# Patient Record
Sex: Male | Born: 1970 | Race: White | Hispanic: No | Marital: Married | Smoking: Never smoker
Health system: Southern US, Community
[De-identification: ages and names within clinical notes are randomized; demographics above are authoritative.]

## PROBLEM LIST (undated history)

## (undated) DIAGNOSIS — N200 Calculus of kidney: Secondary | ICD-10-CM

## (undated) DIAGNOSIS — M549 Dorsalgia, unspecified: Secondary | ICD-10-CM

## (undated) DIAGNOSIS — G8929 Other chronic pain: Secondary | ICD-10-CM

## (undated) DIAGNOSIS — I10 Essential (primary) hypertension: Secondary | ICD-10-CM

---

## 2016-03-25 DIAGNOSIS — R079 Chest pain, unspecified: Secondary | ICD-10-CM | POA: Diagnosis not present

## 2016-03-25 DIAGNOSIS — I10 Essential (primary) hypertension: Secondary | ICD-10-CM | POA: Diagnosis not present

## 2016-03-26 DIAGNOSIS — R079 Chest pain, unspecified: Secondary | ICD-10-CM | POA: Diagnosis not present

## 2016-03-26 DIAGNOSIS — I10 Essential (primary) hypertension: Secondary | ICD-10-CM | POA: Diagnosis not present

## 2016-03-27 DIAGNOSIS — R079 Chest pain, unspecified: Secondary | ICD-10-CM | POA: Diagnosis not present

## 2016-03-27 DIAGNOSIS — I10 Essential (primary) hypertension: Secondary | ICD-10-CM | POA: Diagnosis not present

## 2016-03-28 DIAGNOSIS — R079 Chest pain, unspecified: Secondary | ICD-10-CM | POA: Diagnosis not present

## 2016-03-28 DIAGNOSIS — I10 Essential (primary) hypertension: Secondary | ICD-10-CM | POA: Diagnosis not present

## 2016-06-30 DIAGNOSIS — R109 Unspecified abdominal pain: Secondary | ICD-10-CM | POA: Diagnosis not present

## 2016-06-30 DIAGNOSIS — N200 Calculus of kidney: Secondary | ICD-10-CM | POA: Diagnosis not present

## 2016-06-30 DIAGNOSIS — N132 Hydronephrosis with renal and ureteral calculous obstruction: Secondary | ICD-10-CM | POA: Diagnosis not present

## 2016-07-02 DIAGNOSIS — N200 Calculus of kidney: Secondary | ICD-10-CM | POA: Diagnosis not present

## 2016-07-02 DIAGNOSIS — I10 Essential (primary) hypertension: Secondary | ICD-10-CM | POA: Diagnosis not present

## 2016-07-02 DIAGNOSIS — N23 Unspecified renal colic: Secondary | ICD-10-CM | POA: Diagnosis not present

## 2016-07-02 DIAGNOSIS — N202 Calculus of kidney with calculus of ureter: Secondary | ICD-10-CM | POA: Diagnosis not present

## 2016-07-02 DIAGNOSIS — Z79899 Other long term (current) drug therapy: Secondary | ICD-10-CM | POA: Diagnosis not present

## 2016-07-02 DIAGNOSIS — N201 Calculus of ureter: Secondary | ICD-10-CM | POA: Diagnosis not present

## 2016-07-03 DIAGNOSIS — N201 Calculus of ureter: Secondary | ICD-10-CM | POA: Diagnosis not present

## 2016-07-03 DIAGNOSIS — N23 Unspecified renal colic: Secondary | ICD-10-CM | POA: Diagnosis not present

## 2016-07-03 DIAGNOSIS — I10 Essential (primary) hypertension: Secondary | ICD-10-CM | POA: Diagnosis not present

## 2016-07-03 DIAGNOSIS — Z79899 Other long term (current) drug therapy: Secondary | ICD-10-CM | POA: Diagnosis not present

## 2016-07-04 DIAGNOSIS — N201 Calculus of ureter: Secondary | ICD-10-CM | POA: Diagnosis not present

## 2016-07-04 DIAGNOSIS — N3020 Other chronic cystitis without hematuria: Secondary | ICD-10-CM | POA: Diagnosis not present

## 2016-07-06 DIAGNOSIS — N201 Calculus of ureter: Secondary | ICD-10-CM | POA: Diagnosis not present

## 2017-03-03 DIAGNOSIS — R634 Abnormal weight loss: Secondary | ICD-10-CM | POA: Diagnosis not present

## 2017-03-03 DIAGNOSIS — I10 Essential (primary) hypertension: Secondary | ICD-10-CM | POA: Diagnosis not present

## 2017-03-03 DIAGNOSIS — M549 Dorsalgia, unspecified: Secondary | ICD-10-CM | POA: Diagnosis not present

## 2017-03-03 DIAGNOSIS — Z6826 Body mass index (BMI) 26.0-26.9, adult: Secondary | ICD-10-CM | POA: Diagnosis not present

## 2017-03-24 DIAGNOSIS — M545 Low back pain: Secondary | ICD-10-CM | POA: Diagnosis not present

## 2017-03-24 DIAGNOSIS — M549 Dorsalgia, unspecified: Secondary | ICD-10-CM | POA: Diagnosis not present

## 2017-03-24 DIAGNOSIS — I10 Essential (primary) hypertension: Secondary | ICD-10-CM | POA: Diagnosis not present

## 2017-06-20 DIAGNOSIS — I10 Essential (primary) hypertension: Secondary | ICD-10-CM | POA: Diagnosis not present

## 2017-06-20 DIAGNOSIS — M791 Myalgia: Secondary | ICD-10-CM | POA: Diagnosis not present

## 2017-06-20 DIAGNOSIS — R42 Dizziness and giddiness: Secondary | ICD-10-CM | POA: Diagnosis not present

## 2017-06-20 DIAGNOSIS — R5383 Other fatigue: Secondary | ICD-10-CM | POA: Diagnosis not present

## 2017-07-09 DIAGNOSIS — N200 Calculus of kidney: Secondary | ICD-10-CM | POA: Diagnosis not present

## 2017-07-09 DIAGNOSIS — N201 Calculus of ureter: Secondary | ICD-10-CM | POA: Diagnosis not present

## 2017-07-10 DIAGNOSIS — N201 Calculus of ureter: Secondary | ICD-10-CM | POA: Diagnosis not present

## 2017-07-10 DIAGNOSIS — N1330 Unspecified hydronephrosis: Secondary | ICD-10-CM | POA: Diagnosis not present

## 2017-07-10 DIAGNOSIS — Z79899 Other long term (current) drug therapy: Secondary | ICD-10-CM | POA: Diagnosis not present

## 2017-07-10 DIAGNOSIS — A419 Sepsis, unspecified organism: Secondary | ICD-10-CM | POA: Diagnosis not present

## 2017-07-10 DIAGNOSIS — Z87442 Personal history of urinary calculi: Secondary | ICD-10-CM | POA: Diagnosis not present

## 2017-07-10 DIAGNOSIS — N179 Acute kidney failure, unspecified: Secondary | ICD-10-CM | POA: Diagnosis not present

## 2017-07-10 DIAGNOSIS — I10 Essential (primary) hypertension: Secondary | ICD-10-CM | POA: Diagnosis not present

## 2017-07-10 DIAGNOSIS — N139 Obstructive and reflux uropathy, unspecified: Secondary | ICD-10-CM | POA: Diagnosis not present

## 2017-07-10 DIAGNOSIS — N200 Calculus of kidney: Secondary | ICD-10-CM | POA: Diagnosis not present

## 2017-07-10 DIAGNOSIS — R109 Unspecified abdominal pain: Secondary | ICD-10-CM | POA: Diagnosis not present

## 2017-07-10 DIAGNOSIS — N132 Hydronephrosis with renal and ureteral calculous obstruction: Secondary | ICD-10-CM | POA: Diagnosis not present

## 2017-07-10 DIAGNOSIS — N390 Urinary tract infection, site not specified: Secondary | ICD-10-CM | POA: Diagnosis not present

## 2017-07-11 DIAGNOSIS — N201 Calculus of ureter: Secondary | ICD-10-CM | POA: Diagnosis not present

## 2017-07-11 DIAGNOSIS — N139 Obstructive and reflux uropathy, unspecified: Secondary | ICD-10-CM | POA: Diagnosis not present

## 2017-07-11 DIAGNOSIS — N132 Hydronephrosis with renal and ureteral calculous obstruction: Secondary | ICD-10-CM | POA: Diagnosis not present

## 2017-07-11 DIAGNOSIS — N1330 Unspecified hydronephrosis: Secondary | ICD-10-CM | POA: Diagnosis not present

## 2017-07-11 DIAGNOSIS — N390 Urinary tract infection, site not specified: Secondary | ICD-10-CM | POA: Diagnosis not present

## 2017-07-12 DIAGNOSIS — N201 Calculus of ureter: Secondary | ICD-10-CM | POA: Diagnosis not present

## 2017-07-12 DIAGNOSIS — N390 Urinary tract infection, site not specified: Secondary | ICD-10-CM | POA: Diagnosis not present

## 2017-07-12 DIAGNOSIS — N132 Hydronephrosis with renal and ureteral calculous obstruction: Secondary | ICD-10-CM | POA: Diagnosis not present

## 2017-07-12 DIAGNOSIS — N139 Obstructive and reflux uropathy, unspecified: Secondary | ICD-10-CM | POA: Diagnosis not present

## 2017-07-28 DIAGNOSIS — N201 Calculus of ureter: Secondary | ICD-10-CM | POA: Diagnosis not present

## 2017-07-28 DIAGNOSIS — N200 Calculus of kidney: Secondary | ICD-10-CM | POA: Diagnosis not present

## 2017-07-28 DIAGNOSIS — I10 Essential (primary) hypertension: Secondary | ICD-10-CM | POA: Diagnosis not present

## 2017-07-28 DIAGNOSIS — Z9289 Personal history of other medical treatment: Secondary | ICD-10-CM | POA: Diagnosis not present

## 2017-07-28 DIAGNOSIS — N3020 Other chronic cystitis without hematuria: Secondary | ICD-10-CM | POA: Diagnosis not present

## 2017-07-28 DIAGNOSIS — M549 Dorsalgia, unspecified: Secondary | ICD-10-CM | POA: Diagnosis not present

## 2017-08-24 DIAGNOSIS — I10 Essential (primary) hypertension: Secondary | ICD-10-CM | POA: Diagnosis not present

## 2017-08-24 DIAGNOSIS — M542 Cervicalgia: Secondary | ICD-10-CM | POA: Diagnosis not present

## 2017-08-24 DIAGNOSIS — M545 Low back pain: Secondary | ICD-10-CM | POA: Diagnosis not present

## 2017-08-24 DIAGNOSIS — M549 Dorsalgia, unspecified: Secondary | ICD-10-CM | POA: Diagnosis not present

## 2017-11-09 DIAGNOSIS — I10 Essential (primary) hypertension: Secondary | ICD-10-CM | POA: Diagnosis not present

## 2018-03-30 DIAGNOSIS — Z1331 Encounter for screening for depression: Secondary | ICD-10-CM | POA: Diagnosis not present

## 2018-03-30 DIAGNOSIS — I10 Essential (primary) hypertension: Secondary | ICD-10-CM | POA: Diagnosis not present

## 2018-03-30 DIAGNOSIS — R5382 Chronic fatigue, unspecified: Secondary | ICD-10-CM | POA: Diagnosis not present

## 2018-03-30 DIAGNOSIS — N200 Calculus of kidney: Secondary | ICD-10-CM | POA: Diagnosis not present

## 2018-03-30 DIAGNOSIS — F5104 Psychophysiologic insomnia: Secondary | ICD-10-CM | POA: Diagnosis not present

## 2018-03-30 DIAGNOSIS — M545 Low back pain: Secondary | ICD-10-CM | POA: Diagnosis not present

## 2018-04-12 DIAGNOSIS — F5104 Psychophysiologic insomnia: Secondary | ICD-10-CM | POA: Diagnosis not present

## 2018-04-12 DIAGNOSIS — M545 Low back pain: Secondary | ICD-10-CM | POA: Diagnosis not present

## 2018-04-12 DIAGNOSIS — N200 Calculus of kidney: Secondary | ICD-10-CM | POA: Diagnosis not present

## 2018-04-12 DIAGNOSIS — I10 Essential (primary) hypertension: Secondary | ICD-10-CM | POA: Diagnosis not present

## 2018-04-20 DIAGNOSIS — N200 Calculus of kidney: Secondary | ICD-10-CM | POA: Diagnosis not present

## 2018-04-20 DIAGNOSIS — I10 Essential (primary) hypertension: Secondary | ICD-10-CM | POA: Diagnosis not present

## 2018-04-20 DIAGNOSIS — E291 Testicular hypofunction: Secondary | ICD-10-CM | POA: Diagnosis not present

## 2018-04-20 DIAGNOSIS — M545 Low back pain: Secondary | ICD-10-CM | POA: Diagnosis not present

## 2018-04-20 DIAGNOSIS — F5104 Psychophysiologic insomnia: Secondary | ICD-10-CM | POA: Diagnosis not present

## 2018-05-06 DIAGNOSIS — N200 Calculus of kidney: Secondary | ICD-10-CM | POA: Diagnosis not present

## 2018-05-06 DIAGNOSIS — R55 Syncope and collapse: Secondary | ICD-10-CM | POA: Diagnosis not present

## 2018-05-06 DIAGNOSIS — R531 Weakness: Secondary | ICD-10-CM | POA: Diagnosis not present

## 2018-05-06 DIAGNOSIS — E860 Dehydration: Secondary | ICD-10-CM | POA: Diagnosis not present

## 2018-05-06 DIAGNOSIS — I10 Essential (primary) hypertension: Secondary | ICD-10-CM | POA: Diagnosis not present

## 2018-05-06 DIAGNOSIS — R42 Dizziness and giddiness: Secondary | ICD-10-CM | POA: Diagnosis not present

## 2018-05-06 DIAGNOSIS — N179 Acute kidney failure, unspecified: Secondary | ICD-10-CM | POA: Diagnosis not present

## 2018-05-06 DIAGNOSIS — N134 Hydroureter: Secondary | ICD-10-CM | POA: Diagnosis not present

## 2018-05-06 DIAGNOSIS — G8929 Other chronic pain: Secondary | ICD-10-CM | POA: Diagnosis not present

## 2018-05-06 DIAGNOSIS — I951 Orthostatic hypotension: Secondary | ICD-10-CM | POA: Diagnosis not present

## 2018-05-07 DIAGNOSIS — I10 Essential (primary) hypertension: Secondary | ICD-10-CM | POA: Diagnosis not present

## 2018-05-07 DIAGNOSIS — N201 Calculus of ureter: Secondary | ICD-10-CM | POA: Diagnosis not present

## 2018-05-07 DIAGNOSIS — R55 Syncope and collapse: Secondary | ICD-10-CM | POA: Diagnosis not present

## 2018-05-07 DIAGNOSIS — N179 Acute kidney failure, unspecified: Secondary | ICD-10-CM | POA: Diagnosis not present

## 2018-05-09 ENCOUNTER — Encounter (HOSPITAL_BASED_OUTPATIENT_CLINIC_OR_DEPARTMENT_OTHER): Payer: Self-pay | Admitting: *Deleted

## 2018-05-09 ENCOUNTER — Emergency Department (HOSPITAL_BASED_OUTPATIENT_CLINIC_OR_DEPARTMENT_OTHER)
Admission: EM | Admit: 2018-05-09 | Discharge: 2018-05-09 | Disposition: A | Discharge status: Home / Self Care | Payer: BLUE CROSS/BLUE SHIELD | Attending: Emergency Medicine | Admitting: Emergency Medicine

## 2018-05-09 ENCOUNTER — Emergency Department (HOSPITAL_BASED_OUTPATIENT_CLINIC_OR_DEPARTMENT_OTHER): Payer: BLUE CROSS/BLUE SHIELD

## 2018-05-09 ENCOUNTER — Other Ambulatory Visit: Payer: Self-pay

## 2018-05-09 DIAGNOSIS — R55 Syncope and collapse: Secondary | ICD-10-CM

## 2018-05-09 DIAGNOSIS — Z79899 Other long term (current) drug therapy: Secondary | ICD-10-CM | POA: Insufficient documentation

## 2018-05-09 DIAGNOSIS — I10 Essential (primary) hypertension: Secondary | ICD-10-CM | POA: Diagnosis not present

## 2018-05-09 DIAGNOSIS — R0602 Shortness of breath: Secondary | ICD-10-CM | POA: Diagnosis not present

## 2018-05-09 DIAGNOSIS — R42 Dizziness and giddiness: Secondary | ICD-10-CM | POA: Diagnosis not present

## 2018-05-09 DIAGNOSIS — R002 Palpitations: Secondary | ICD-10-CM | POA: Diagnosis not present

## 2018-05-09 DIAGNOSIS — R0789 Other chest pain: Secondary | ICD-10-CM | POA: Diagnosis not present

## 2018-05-09 HISTORY — DX: Essential (primary) hypertension: I10

## 2018-05-09 HISTORY — DX: Calculus of kidney: N20.0

## 2018-05-09 HISTORY — DX: Dorsalgia, unspecified: M54.9

## 2018-05-09 HISTORY — DX: Other chronic pain: G89.29

## 2018-05-09 LAB — CBC WITH DIFFERENTIAL/PLATELET
BASOS ABS: 0 10*3/uL (ref 0.0–0.1)
BASOS PCT: 0 %
Eosinophils Absolute: 0.1 10*3/uL (ref 0.0–0.7)
Eosinophils Relative: 1 %
HCT: 39.1 % (ref 39.0–52.0)
HEMOGLOBIN: 14.1 g/dL (ref 13.0–17.0)
LYMPHS PCT: 19 %
Lymphs Abs: 2.5 10*3/uL (ref 0.7–4.0)
MCH: 30.4 pg (ref 26.0–34.0)
MCHC: 36.1 g/dL — ABNORMAL HIGH (ref 30.0–36.0)
MCV: 84.3 fL (ref 78.0–100.0)
MONO ABS: 0.8 10*3/uL (ref 0.1–1.0)
Monocytes Relative: 6 %
Neutro Abs: 9.6 10*3/uL — ABNORMAL HIGH (ref 1.7–7.7)
Neutrophils Relative %: 74 %
Platelets: 275 10*3/uL (ref 150–400)
RBC: 4.64 MIL/uL (ref 4.22–5.81)
RDW: 12.8 % (ref 11.5–15.5)
WBC: 12.9 10*3/uL — ABNORMAL HIGH (ref 4.0–10.5)

## 2018-05-09 LAB — BASIC METABOLIC PANEL
ANION GAP: 9 (ref 5–15)
BUN: 13 mg/dL (ref 6–20)
CO2: 30 mmol/L (ref 22–32)
Calcium: 9.2 mg/dL (ref 8.9–10.3)
Chloride: 102 mmol/L (ref 98–111)
Creatinine, Ser: 1.29 mg/dL — ABNORMAL HIGH (ref 0.61–1.24)
GFR calc non Af Amer: >60 mL/min (ref >60)
GLUCOSE: 94 mg/dL (ref 70–99)
POTASSIUM: 4.3 mmol/L (ref 3.5–5.1)
Sodium: 141 mmol/L (ref 135–145)

## 2018-05-09 LAB — RAPID URINE DRUG SCREEN, HOSP PERFORMED
AMPHETAMINES: NOT DETECTED
Benzodiazepines: NOT DETECTED
Cocaine: NOT DETECTED
Opiates: NOT DETECTED
TETRAHYDROCANNABINOL: NOT DETECTED

## 2018-05-09 LAB — URINALYSIS, ROUTINE W REFLEX MICROSCOPIC
BILIRUBIN URINE: NEGATIVE
GLUCOSE, UA: NEGATIVE mg/dL
Hgb urine dipstick: NEGATIVE
Ketones, ur: NEGATIVE mg/dL
LEUKOCYTES UA: NEGATIVE
NITRITE: NEGATIVE
PH: 7 (ref 5.0–8.0)
PROTEIN: NEGATIVE mg/dL

## 2018-05-09 LAB — TROPONIN I: Troponin I: <0.03 ng/mL (ref <0.03)

## 2018-05-09 LAB — D-DIMER, QUANTITATIVE: D-Dimer, Quant: 0.39 ug/mL-FEU (ref 0.00–0.50)

## 2018-05-09 MED ORDER — SODIUM CHLORIDE 0.9 % IV BOLUS
500.0000 mL | Freq: Once | INTRAVENOUS | Status: AC
  Administered 2018-05-09: 500 mL via INTRAVENOUS

## 2018-05-09 NOTE — ED Notes (Signed)
Pt in XR. 

## 2018-05-09 NOTE — ED Notes (Signed)
Per wife, pt has lost about 20 lbs in 2-3 weeks

## 2018-05-09 NOTE — ED Provider Notes (Signed)
[facility] EMERGENCY DEPARTMENT Provider Note   CSN: [id number] Arrival date & time: 05/09/18  1257     History   Chief Complaint Chief Complaint  Patient presents with  . Dizziness    HPI [facility] Gaynor Ferreras. is a 47 y.o. male.  HPI Patient is a 47 year old male with a history of hypertension, kidney stones and chronic back pain who presents the emergency department today with his wife to be evaluated for lightheadedness and near syncope for the last 1 to 2 weeks.  Patient's wife is at bedside and assists with the history.  She states that patient was seen in the [place] [facility] on 04/06/2018 with similar symptoms.  He was found to have orthostatic hypotension with blood pressures around the 80/50 range.  He was given several liters of fluids and admitted to the hospital. He was also found to have a ureteronephrolithiasis with hydronephrosis. His symptoms improved after fluids he was ultimately discharged with instructions to d/c his antihypertensive medication until he was seen by his pcp. States he was discharged yesterday and has PCP appt tomorrow, however he returned to work today and began to have sxs again.   Pt reports that he has had several episodes of near syncope today. He states that when he stands up he feels lightheaded, "feels shaky all over", his eyes got blurry and his hearing becomes muffled. States he also feels somewhat short of breath during the episodes. He endorses constant midsternal chest pressure for the last 2 days. States that sxs are mild. Reports chronic palpitations that are unchanged during the episodes. Denies that he had actually had a syncopal event. States that symptoms improve when he sits down and rests. Reports that currently he feels like he has a weight on his chest.  States sypmotms are mild. No current SOB. Reports nausea, no vomiting. Mild dry cough, no hemoptysis.  No BLE swelling. No fevers. No numbness/weakness to arms or legs. No slurred  speech or facial droop. No h/o DVT/PE.   Wife at bedside states that pt has lost about 15-20 pounds in about 2-3 weeks. She is unsure if this is attributed to stress as she was recently diagnosed with VTE and pt has been under stress. He denies any changes in diet. Denies new medications.   Has a h/o HTN. No HLD. Does not use tobacco and denies h/o tobacco use. States his dad had an MI at 40 years old.   Reviewed prior records. Pt had cath in 03/28/2016 that showed,  "1. Normal coronary arteriogram. 2. Normal LV systolic function, EF 55-60% 3. Normal LV regional wall motion. 4. Normal LVEDP.  LMCA: Normal appearance with 0% stenosis. LAD: Normal appearance with 0% stenosis. LCx: Normal appearance with 0% stenosis. RCA: Normal appearance with 0% stenosis."  Past Medical History:  Diagnosis Date  . Chronic back pain   . Hypertension   . Kidney stones     There are no active problems to display for this patient.   History reviewed. No pertinent surgical history.      Home Medications    Prior to Admission medications   Medication Sig Start Date End Date Taking? Authorizing Provider  amLODipine (NORVASC) 10 MG tablet Take 10 mg by mouth daily.   Yes [provider]  DULoxetine (CYMBALTA) 60 MG capsule Take 60 mg by mouth daily.   Yes [provider]  gabapentin (NEURONTIN) 300 MG capsule Take 300 mg by mouth 3 (three) times daily.   Yes  [provider]  lisinopril (PRINIVIL,ZESTRIL) 20 MG tablet Take 20 mg by mouth daily.   Yes [provider]  Meloxicam 7.5 MG TBDP Take by mouth.   Yes [provider]  traMADol (ULTRAM) 50 MG tablet Take by mouth every 6 (six) hours as needed.   Yes [provider]    Family History History reviewed. No pertinent family history.  Social History Social History   Tobacco Use  . Smoking status: Never Smoker  . Smokeless tobacco: Current User    Types: Snuff  Substance Use Topics  .  Alcohol use: Not Currently  . Drug use: Never     Allergies   Patient has no known allergies.   Review of Systems Review of Systems  Constitutional: Negative for fever.  HENT: Negative for congestion, rhinorrhea and sore throat.   Eyes: Negative for visual disturbance.  Respiratory: Positive for cough. Negative for shortness of breath.   Cardiovascular: Positive for palpitations (chronic). Negative for leg swelling.       Chest pressure  Gastrointestinal: Negative for abdominal pain, constipation, diarrhea, nausea and vomiting.  Genitourinary: Negative for dysuria, flank pain, frequency, hematuria and urgency.  Musculoskeletal: Positive for back pain (chronic).  Skin: Negative for color change and wound.  Neurological: Positive for light-headedness. Negative for dizziness, weakness, numbness and headaches.       Near syncope    Physical Exam Updated Vital Signs BP (!) 144/95   Pulse 62   Temp 98.3 F (36.8 C) (Oral)   Resp 20   Ht 6\' 2"  (1.88 m)   Wt 92.5 kg (204 lb)   SpO2 97%   BMI 26.19 kg/m   Physical Exam  Constitutional: He appears well-developed and well-nourished.  Nontoxic appearing  HENT:  Head: Normocephalic and atraumatic.  Mouth/Throat: Oropharynx is clear and moist.  Eyes: Pupils are equal, round, and reactive to light. Conjunctivae and EOM are normal.  No horizontal or vertical nystagmus. Pupils 4-5 mm bilaterally.  Neck: Neck supple.  Cardiovascular: Normal rate, regular rhythm, normal heart sounds and intact distal pulses.  No murmur heard. Pulmonary/Chest: Effort normal and breath sounds normal. No stridor. No respiratory distress. He has no wheezes.  Abdominal: Soft. Bowel sounds are normal. He exhibits no distension. There is no tenderness. There is no guarding.  Musculoskeletal: He exhibits no edema.  Neurological: He is alert.  Mental Status:  Alert, thought content appropriate, able to give a coherent history. Speech fluent without evidence  of aphasia. Able to follow 2 step commands without difficulty.  Cranial Nerves:  II:  Peripheral visual fields grossly normal, pupils equal, round, reactive to light III,IV, VI: ptosis not present, extra-ocular motions intact bilaterally  V,VII: smile symmetric, facial light touch sensation equal VIII: hearing grossly normal to voice  X: uvula elevates symmetrically  XI: bilateral shoulder shrug symmetric and strong XII: midline tongue extension without fassiculations Motor:  Normal tone. 5/5 strength of BUE and BLE major muscle groups including strong and equal grip strength and dorsiflexion/plantar flexion Sensory: light touch normal in all extremities. DTRs: biceps and achilles 2+ symmetric b/l Cerebellar: normal finger-to-nose with bilateral upper extremities, normal heel-to-shin bilaterally Gait: normal gait and balance.  CV: 2+ radial and DP/PT pulses Negative pronator drift.  Negative Romberg.  Skin: Skin is warm and dry. Capillary refill takes less than 2 seconds.  Psychiatric: He has a normal mood and affect.  Nursing note and vitals reviewed.  ED Treatments / Results  Labs (all labs ordered are listed,  but only abnormal results are displayed) Labs Reviewed  CBC WITH DIFFERENTIAL/PLATELET - Abnormal; Notable for the following components:      Result Value   WBC 12.9 (*)    MCHC 36.1 (*)    Neutro Abs 9.6 (*)    All other components within normal limits  BASIC METABOLIC PANEL - Abnormal; Notable for the following components:   Creatinine, Ser 1.29 (*)    All other components within normal limits  URINALYSIS, ROUTINE W REFLEX MICROSCOPIC - Abnormal; Notable for the following components:   Specific Gravity, Urine <1.005 (*)    All other components within normal limits  RAPID URINE DRUG SCREEN, HOSP PERFORMED - Abnormal; Notable for the following components:   Barbiturates   (*)    Value: Result not available. Reagent lot number recalled by manufacturer.   All other  components within normal limits  URINE CULTURE  TROPONIN I  D-DIMER, QUANTITATIVE (NOT AT [facility])    EKG EKG Interpretation  Date/Time:  Wednesday May 09 2018 15:02:50 EDT Ventricular Rate:  70 PR Interval:    QRS Duration: 91 QT Interval:  392 QTC Calculation: 423 R Axis:   -11 Text Interpretation:  Sinus rhythm No old tracing to compare Confirmed by Azalia Bilisampos, Kevin ([id number]) on 05/09/2018 4:15:48 PM   Radiology Dg Chest 2 View  Result Date: 05/09/2018 CLINICAL DATA:  47 year old male with chest pressure for 2 days. Shortness of breath this morning, near-syncope. EXAM: CHEST - 2 VIEW COMPARISON:  05/06/2018 and earlier. FINDINGS: Lung volumes and mediastinal contours are within normal limits. Visualized tracheal air column is within normal limits. Both lungs appear clear. No pneumothorax or pleural effusion. Negative visible bowel gas and osseous structures. IMPRESSION: Negative.  No acute cardiopulmonary abnormality. Electronically Signed   By: Odessa FlemingH  Hall M.D.   On: 05/09/2018 15:17    Procedures Procedures (including critical care time)  Medications Ordered in ED Medications  sodium chloride 0.9 % bolus 500 mL (0 mLs Intravenous Stopped 05/09/18 1645)     Initial Impression / Assessment and Plan / ED Course  I have reviewed the triage vital signs and the nursing notes.  Pertinent labs & imaging results that were available during my care of the patient were reviewed by me and considered in my medical decision making (see chart for details).    Discussed pt presentation and exam findings with Dr. Patria Maneampos, who agrees with the current workup.  Personally evaluated the patient and agrees with the plan for discharge with close outpatient follow-up with his PCP and cardiologist.  [facility] reviewed.  Final Clinical Impressions(s) / ED Diagnoses   Final diagnoses:  Near syncope   Patient presented with near syncope.  Was [facility] and  had extensive work-up during this admission.  On this admission was found to have orthostatic hypotension.  Symptoms improved after menstruation of IV fluids.  Patient presenting today and has stable vital signs other than some mild hypertension.  Note to be to this to him easily discontinuing his antihypertensive medication after being instructed to do so during his last admission.  He is not orthostatic today.  His neurologic exam is nonfocal and it is benign.  Cardiac and pulmonary exams are benign.  CBC notable for leukocytosis 12.9. Nonspecific finding that Could be attributed to his recently diagnosed ureteral stone.  BMP with mildly elevated creatinine to 1.29.  Troponin negative.  UA negative for UTI.  No hematuria noted.  UDS negative.  D-dimer negative.  ECG NSR, no ischemic changes. No arrhythmia. CXR without evidence of pneumonia or other abnormality.  Etiology of patient's symptoms today are currently unclear, however feel that the patient has been reasonably screened and does not have any other evidence of emergent/urgent pathology that would require further work-up or admission to the hospital today as his workup today has been grossly negative and he had a benign physical exam. Advised the patient that he needs to follow-up with his primary care doctor tomorrow for reevaluation and further work-up.  Advised also to contact his cardiologist with regard to his complaints of chest pressure today.  Advised him to return to the ER for any new or worsening symptoms in the meantime.  Patient and wife at bedside understand the plan and reasons to return immediately to the ED.  All questions answered.  ED Discharge Orders    None       Rayne Du 05/09/18 1811    Azalia Bilis, MD 05/10/18 1029

## 2018-05-09 NOTE — Discharge Instructions (Signed)
Please follow up with your primary care provider within 5-7 days for re-evaluation of your symptoms. If you do not have a primary care provider, information for a healthcare clinic has been provided for you to make arrangements for follow up care. Please return to the emergency department for any new or worsening symptoms. ° °

## 2018-05-09 NOTE — ED Triage Notes (Signed)
Pt c/o dizziness x 2 weeks, admit [facility] for 2 days DX dehydration , unable to f/u with PMD

## 2018-05-10 DIAGNOSIS — F5104 Psychophysiologic insomnia: Secondary | ICD-10-CM | POA: Diagnosis not present

## 2018-05-10 DIAGNOSIS — I10 Essential (primary) hypertension: Secondary | ICD-10-CM | POA: Diagnosis not present

## 2018-05-10 DIAGNOSIS — N200 Calculus of kidney: Secondary | ICD-10-CM | POA: Diagnosis not present

## 2018-05-10 LAB — URINE CULTURE: CULTURE: NO GROWTH

## 2018-05-11 DIAGNOSIS — R5383 Other fatigue: Secondary | ICD-10-CM | POA: Diagnosis not present

## 2018-05-11 DIAGNOSIS — Z79899 Other long term (current) drug therapy: Secondary | ICD-10-CM | POA: Diagnosis not present

## 2018-05-11 DIAGNOSIS — R55 Syncope and collapse: Secondary | ICD-10-CM | POA: Diagnosis not present

## 2018-05-14 DIAGNOSIS — I10 Essential (primary) hypertension: Secondary | ICD-10-CM | POA: Diagnosis not present

## 2018-05-14 DIAGNOSIS — F5104 Psychophysiologic insomnia: Secondary | ICD-10-CM | POA: Diagnosis not present

## 2018-05-14 DIAGNOSIS — Z79899 Other long term (current) drug therapy: Secondary | ICD-10-CM | POA: Diagnosis not present

## 2018-05-14 DIAGNOSIS — N200 Calculus of kidney: Secondary | ICD-10-CM | POA: Diagnosis not present

## 2018-05-16 DIAGNOSIS — E860 Dehydration: Secondary | ICD-10-CM | POA: Diagnosis not present

## 2018-05-16 DIAGNOSIS — D519 Vitamin B12 deficiency anemia, unspecified: Secondary | ICD-10-CM | POA: Diagnosis not present

## 2018-05-16 DIAGNOSIS — N1330 Unspecified hydronephrosis: Secondary | ICD-10-CM | POA: Diagnosis not present

## 2018-05-16 DIAGNOSIS — G8929 Other chronic pain: Secondary | ICD-10-CM | POA: Diagnosis not present

## 2018-05-16 DIAGNOSIS — I10 Essential (primary) hypertension: Secondary | ICD-10-CM | POA: Diagnosis not present

## 2018-05-16 DIAGNOSIS — M545 Low back pain: Secondary | ICD-10-CM | POA: Diagnosis not present

## 2018-05-16 DIAGNOSIS — R0789 Other chest pain: Secondary | ICD-10-CM | POA: Diagnosis not present

## 2018-05-16 DIAGNOSIS — R634 Abnormal weight loss: Secondary | ICD-10-CM | POA: Diagnosis not present

## 2018-05-16 DIAGNOSIS — I951 Orthostatic hypotension: Secondary | ICD-10-CM | POA: Diagnosis not present

## 2018-05-16 DIAGNOSIS — N179 Acute kidney failure, unspecified: Secondary | ICD-10-CM | POA: Diagnosis not present

## 2018-05-16 DIAGNOSIS — R6881 Early satiety: Secondary | ICD-10-CM | POA: Diagnosis not present

## 2018-05-16 DIAGNOSIS — R55 Syncope and collapse: Secondary | ICD-10-CM | POA: Diagnosis not present

## 2018-05-16 DIAGNOSIS — R42 Dizziness and giddiness: Secondary | ICD-10-CM | POA: Diagnosis not present

## 2018-05-16 DIAGNOSIS — N132 Hydronephrosis with renal and ureteral calculous obstruction: Secondary | ICD-10-CM | POA: Diagnosis not present

## 2018-05-16 DIAGNOSIS — K641 Second degree hemorrhoids: Secondary | ICD-10-CM | POA: Diagnosis not present

## 2018-05-16 DIAGNOSIS — R000 Tachycardia, unspecified: Secondary | ICD-10-CM | POA: Diagnosis not present

## 2018-05-16 DIAGNOSIS — R109 Unspecified abdominal pain: Secondary | ICD-10-CM | POA: Diagnosis not present

## 2018-05-16 DIAGNOSIS — D509 Iron deficiency anemia, unspecified: Secondary | ICD-10-CM | POA: Diagnosis not present

## 2018-05-16 DIAGNOSIS — R0902 Hypoxemia: Secondary | ICD-10-CM | POA: Diagnosis not present

## 2018-05-16 DIAGNOSIS — N261 Atrophy of kidney (terminal): Secondary | ICD-10-CM | POA: Diagnosis not present

## 2018-05-16 DIAGNOSIS — N289 Disorder of kidney and ureter, unspecified: Secondary | ICD-10-CM | POA: Diagnosis not present

## 2018-05-16 DIAGNOSIS — R079 Chest pain, unspecified: Secondary | ICD-10-CM | POA: Diagnosis not present

## 2018-05-17 DIAGNOSIS — D519 Vitamin B12 deficiency anemia, unspecified: Secondary | ICD-10-CM | POA: Diagnosis not present

## 2018-05-17 DIAGNOSIS — M545 Low back pain: Secondary | ICD-10-CM | POA: Diagnosis not present

## 2018-05-17 DIAGNOSIS — R6881 Early satiety: Secondary | ICD-10-CM | POA: Diagnosis not present

## 2018-05-17 DIAGNOSIS — I10 Essential (primary) hypertension: Secondary | ICD-10-CM | POA: Diagnosis not present

## 2018-05-17 DIAGNOSIS — K641 Second degree hemorrhoids: Secondary | ICD-10-CM | POA: Diagnosis not present

## 2018-05-17 DIAGNOSIS — N261 Atrophy of kidney (terminal): Secondary | ICD-10-CM | POA: Diagnosis not present

## 2018-05-17 DIAGNOSIS — R634 Abnormal weight loss: Secondary | ICD-10-CM | POA: Diagnosis not present

## 2018-05-17 DIAGNOSIS — R55 Syncope and collapse: Secondary | ICD-10-CM | POA: Diagnosis not present

## 2018-05-17 DIAGNOSIS — E860 Dehydration: Secondary | ICD-10-CM | POA: Diagnosis not present

## 2018-05-17 DIAGNOSIS — D509 Iron deficiency anemia, unspecified: Secondary | ICD-10-CM | POA: Diagnosis not present

## 2018-05-17 DIAGNOSIS — R109 Unspecified abdominal pain: Secondary | ICD-10-CM | POA: Diagnosis not present

## 2018-05-17 DIAGNOSIS — R42 Dizziness and giddiness: Secondary | ICD-10-CM | POA: Diagnosis not present

## 2018-05-17 DIAGNOSIS — N132 Hydronephrosis with renal and ureteral calculous obstruction: Secondary | ICD-10-CM | POA: Diagnosis not present

## 2018-05-17 DIAGNOSIS — Z791 Long term (current) use of non-steroidal anti-inflammatories (NSAID): Secondary | ICD-10-CM | POA: Diagnosis not present

## 2018-05-17 DIAGNOSIS — I951 Orthostatic hypotension: Secondary | ICD-10-CM | POA: Diagnosis not present

## 2018-05-17 DIAGNOSIS — N179 Acute kidney failure, unspecified: Secondary | ICD-10-CM | POA: Diagnosis not present

## 2018-05-17 DIAGNOSIS — G8929 Other chronic pain: Secondary | ICD-10-CM | POA: Diagnosis not present

## 2018-05-18 DIAGNOSIS — R55 Syncope and collapse: Secondary | ICD-10-CM | POA: Diagnosis not present

## 2018-05-18 DIAGNOSIS — E860 Dehydration: Secondary | ICD-10-CM | POA: Diagnosis not present

## 2018-05-18 DIAGNOSIS — R634 Abnormal weight loss: Secondary | ICD-10-CM | POA: Diagnosis not present

## 2018-05-18 DIAGNOSIS — R109 Unspecified abdominal pain: Secondary | ICD-10-CM | POA: Diagnosis not present

## 2018-05-18 DIAGNOSIS — N179 Acute kidney failure, unspecified: Secondary | ICD-10-CM | POA: Diagnosis not present

## 2018-05-18 DIAGNOSIS — K5730 Diverticulosis of large intestine without perforation or abscess without bleeding: Secondary | ICD-10-CM | POA: Diagnosis not present

## 2018-05-18 DIAGNOSIS — I10 Essential (primary) hypertension: Secondary | ICD-10-CM | POA: Diagnosis not present

## 2018-05-18 DIAGNOSIS — R6881 Early satiety: Secondary | ICD-10-CM | POA: Diagnosis not present

## 2018-05-18 DIAGNOSIS — G8929 Other chronic pain: Secondary | ICD-10-CM | POA: Diagnosis not present

## 2018-05-18 DIAGNOSIS — K317 Polyp of stomach and duodenum: Secondary | ICD-10-CM | POA: Diagnosis not present

## 2018-05-18 DIAGNOSIS — D519 Vitamin B12 deficiency anemia, unspecified: Secondary | ICD-10-CM | POA: Diagnosis not present

## 2018-05-18 DIAGNOSIS — I951 Orthostatic hypotension: Secondary | ICD-10-CM | POA: Diagnosis not present

## 2018-05-18 DIAGNOSIS — D509 Iron deficiency anemia, unspecified: Secondary | ICD-10-CM | POA: Diagnosis not present

## 2018-05-18 DIAGNOSIS — N132 Hydronephrosis with renal and ureteral calculous obstruction: Secondary | ICD-10-CM | POA: Diagnosis not present

## 2018-05-18 DIAGNOSIS — K2930 Chronic superficial gastritis without bleeding: Secondary | ICD-10-CM | POA: Diagnosis not present

## 2018-05-18 DIAGNOSIS — K641 Second degree hemorrhoids: Secondary | ICD-10-CM | POA: Diagnosis not present

## 2018-05-18 DIAGNOSIS — K439 Ventral hernia without obstruction or gangrene: Secondary | ICD-10-CM | POA: Diagnosis not present

## 2018-05-18 DIAGNOSIS — N261 Atrophy of kidney (terminal): Secondary | ICD-10-CM | POA: Diagnosis not present

## 2018-05-18 DIAGNOSIS — M545 Low back pain: Secondary | ICD-10-CM | POA: Diagnosis not present

## 2018-05-18 DIAGNOSIS — B9681 Helicobacter pylori [H. pylori] as the cause of diseases classified elsewhere: Secondary | ICD-10-CM | POA: Diagnosis not present

## 2018-05-18 DIAGNOSIS — R42 Dizziness and giddiness: Secondary | ICD-10-CM | POA: Diagnosis not present

## 2018-05-24 DIAGNOSIS — M545 Low back pain: Secondary | ICD-10-CM | POA: Diagnosis not present

## 2018-05-24 DIAGNOSIS — I10 Essential (primary) hypertension: Secondary | ICD-10-CM | POA: Diagnosis not present

## 2018-05-24 DIAGNOSIS — F5104 Psychophysiologic insomnia: Secondary | ICD-10-CM | POA: Diagnosis not present

## 2018-05-24 DIAGNOSIS — N200 Calculus of kidney: Secondary | ICD-10-CM | POA: Diagnosis not present

## 2018-05-28 DIAGNOSIS — S8991XA Unspecified injury of right lower leg, initial encounter: Secondary | ICD-10-CM | POA: Diagnosis not present

## 2018-05-28 DIAGNOSIS — M25562 Pain in left knee: Secondary | ICD-10-CM | POA: Diagnosis not present

## 2018-05-28 DIAGNOSIS — S8992XA Unspecified injury of left lower leg, initial encounter: Secondary | ICD-10-CM | POA: Diagnosis not present

## 2018-05-28 DIAGNOSIS — M25561 Pain in right knee: Secondary | ICD-10-CM | POA: Diagnosis not present

## 2018-05-31 DIAGNOSIS — M545 Low back pain: Secondary | ICD-10-CM | POA: Diagnosis not present

## 2018-05-31 DIAGNOSIS — E291 Testicular hypofunction: Secondary | ICD-10-CM | POA: Diagnosis not present

## 2018-05-31 DIAGNOSIS — N200 Calculus of kidney: Secondary | ICD-10-CM | POA: Diagnosis not present

## 2018-05-31 DIAGNOSIS — I10 Essential (primary) hypertension: Secondary | ICD-10-CM | POA: Diagnosis not present

## 2018-06-30 DIAGNOSIS — F419 Anxiety disorder, unspecified: Secondary | ICD-10-CM | POA: Diagnosis not present

## 2018-06-30 DIAGNOSIS — F5104 Psychophysiologic insomnia: Secondary | ICD-10-CM | POA: Diagnosis not present

## 2018-06-30 DIAGNOSIS — N200 Calculus of kidney: Secondary | ICD-10-CM | POA: Diagnosis not present

## 2018-06-30 DIAGNOSIS — E663 Overweight: Secondary | ICD-10-CM | POA: Diagnosis not present

## 2018-06-30 DIAGNOSIS — M25562 Pain in left knee: Secondary | ICD-10-CM | POA: Diagnosis not present

## 2018-07-07 DIAGNOSIS — F5104 Psychophysiologic insomnia: Secondary | ICD-10-CM | POA: Diagnosis not present

## 2018-07-07 DIAGNOSIS — N200 Calculus of kidney: Secondary | ICD-10-CM | POA: Diagnosis not present

## 2018-07-07 DIAGNOSIS — M545 Low back pain: Secondary | ICD-10-CM | POA: Diagnosis not present

## 2018-07-07 DIAGNOSIS — E291 Testicular hypofunction: Secondary | ICD-10-CM | POA: Diagnosis not present

## 2018-07-28 DIAGNOSIS — M25561 Pain in right knee: Secondary | ICD-10-CM | POA: Diagnosis not present

## 2018-07-28 DIAGNOSIS — N200 Calculus of kidney: Secondary | ICD-10-CM | POA: Diagnosis not present

## 2018-07-28 DIAGNOSIS — E291 Testicular hypofunction: Secondary | ICD-10-CM | POA: Diagnosis not present

## 2018-07-28 DIAGNOSIS — I10 Essential (primary) hypertension: Secondary | ICD-10-CM | POA: Diagnosis not present

## 2018-08-25 DIAGNOSIS — M545 Low back pain: Secondary | ICD-10-CM | POA: Diagnosis not present

## 2018-08-25 DIAGNOSIS — F5104 Psychophysiologic insomnia: Secondary | ICD-10-CM | POA: Diagnosis not present

## 2018-08-25 DIAGNOSIS — E291 Testicular hypofunction: Secondary | ICD-10-CM | POA: Diagnosis not present

## 2018-10-04 DIAGNOSIS — F5104 Psychophysiologic insomnia: Secondary | ICD-10-CM | POA: Diagnosis not present

## 2018-10-04 DIAGNOSIS — E291 Testicular hypofunction: Secondary | ICD-10-CM | POA: Diagnosis not present

## 2018-10-04 DIAGNOSIS — M545 Low back pain: Secondary | ICD-10-CM | POA: Diagnosis not present

## 2018-10-04 DIAGNOSIS — M25552 Pain in left hip: Secondary | ICD-10-CM | POA: Diagnosis not present

## 2018-10-20 DIAGNOSIS — J111 Influenza due to unidentified influenza virus with other respiratory manifestations: Secondary | ICD-10-CM | POA: Diagnosis not present

## 2018-10-22 DIAGNOSIS — I10 Essential (primary) hypertension: Secondary | ICD-10-CM | POA: Diagnosis not present

## 2018-10-22 DIAGNOSIS — J181 Lobar pneumonia, unspecified organism: Secondary | ICD-10-CM | POA: Diagnosis not present

## 2018-10-22 DIAGNOSIS — R079 Chest pain, unspecified: Secondary | ICD-10-CM | POA: Diagnosis not present

## 2018-10-22 DIAGNOSIS — J111 Influenza due to unidentified influenza virus with other respiratory manifestations: Secondary | ICD-10-CM | POA: Diagnosis not present

## 2018-10-22 DIAGNOSIS — R0602 Shortness of breath: Secondary | ICD-10-CM | POA: Diagnosis not present

## 2018-10-22 DIAGNOSIS — R05 Cough: Secondary | ICD-10-CM | POA: Diagnosis not present

## 2018-10-22 DIAGNOSIS — J1100 Influenza due to unidentified influenza virus with unspecified type of pneumonia: Secondary | ICD-10-CM | POA: Diagnosis not present

## 2018-11-02 DIAGNOSIS — E291 Testicular hypofunction: Secondary | ICD-10-CM | POA: Diagnosis not present

## 2018-11-02 DIAGNOSIS — N200 Calculus of kidney: Secondary | ICD-10-CM | POA: Diagnosis not present

## 2018-11-02 DIAGNOSIS — M545 Low back pain: Secondary | ICD-10-CM | POA: Diagnosis not present

## 2018-11-02 DIAGNOSIS — F5104 Psychophysiologic insomnia: Secondary | ICD-10-CM | POA: Diagnosis not present

## 2018-11-02 DIAGNOSIS — Z23 Encounter for immunization: Secondary | ICD-10-CM | POA: Diagnosis not present

## 2018-11-26 DIAGNOSIS — F5104 Psychophysiologic insomnia: Secondary | ICD-10-CM | POA: Diagnosis not present

## 2018-11-26 DIAGNOSIS — M545 Low back pain: Secondary | ICD-10-CM | POA: Diagnosis not present

## 2018-11-26 DIAGNOSIS — E291 Testicular hypofunction: Secondary | ICD-10-CM | POA: Diagnosis not present

## 2018-11-26 DIAGNOSIS — N200 Calculus of kidney: Secondary | ICD-10-CM | POA: Diagnosis not present

## 2018-12-01 DIAGNOSIS — I10 Essential (primary) hypertension: Secondary | ICD-10-CM | POA: Diagnosis not present

## 2018-12-01 DIAGNOSIS — N200 Calculus of kidney: Secondary | ICD-10-CM | POA: Diagnosis not present

## 2018-12-01 DIAGNOSIS — E291 Testicular hypofunction: Secondary | ICD-10-CM | POA: Diagnosis not present

## 2018-12-01 DIAGNOSIS — F5104 Psychophysiologic insomnia: Secondary | ICD-10-CM | POA: Diagnosis not present

## 2018-12-17 DIAGNOSIS — F5104 Psychophysiologic insomnia: Secondary | ICD-10-CM | POA: Diagnosis not present

## 2018-12-17 DIAGNOSIS — R1032 Left lower quadrant pain: Secondary | ICD-10-CM | POA: Diagnosis not present

## 2018-12-17 DIAGNOSIS — N200 Calculus of kidney: Secondary | ICD-10-CM | POA: Diagnosis not present

## 2018-12-17 DIAGNOSIS — R5382 Chronic fatigue, unspecified: Secondary | ICD-10-CM | POA: Diagnosis not present

## 2018-12-17 DIAGNOSIS — R531 Weakness: Secondary | ICD-10-CM | POA: Diagnosis not present

## 2018-12-17 DIAGNOSIS — R197 Diarrhea, unspecified: Secondary | ICD-10-CM | POA: Diagnosis not present

## 2018-12-17 DIAGNOSIS — E291 Testicular hypofunction: Secondary | ICD-10-CM | POA: Diagnosis not present

## 2018-12-17 DIAGNOSIS — R5383 Other fatigue: Secondary | ICD-10-CM | POA: Diagnosis not present

## 2018-12-18 DIAGNOSIS — R9431 Abnormal electrocardiogram [ECG] [EKG]: Secondary | ICD-10-CM | POA: Diagnosis not present

## 2018-12-24 IMAGING — CR DG CHEST 2V
2 series · 2 of 2 positions shown · non-contrast
Comparison: 05/06/2018 and earlier.

CLINICAL DATA: 46-year-old male with chest pressure for 2 days.
Shortness of breath this morning, near-syncope.

EXAM:
CHEST - 2 VIEW

[w chest pa]
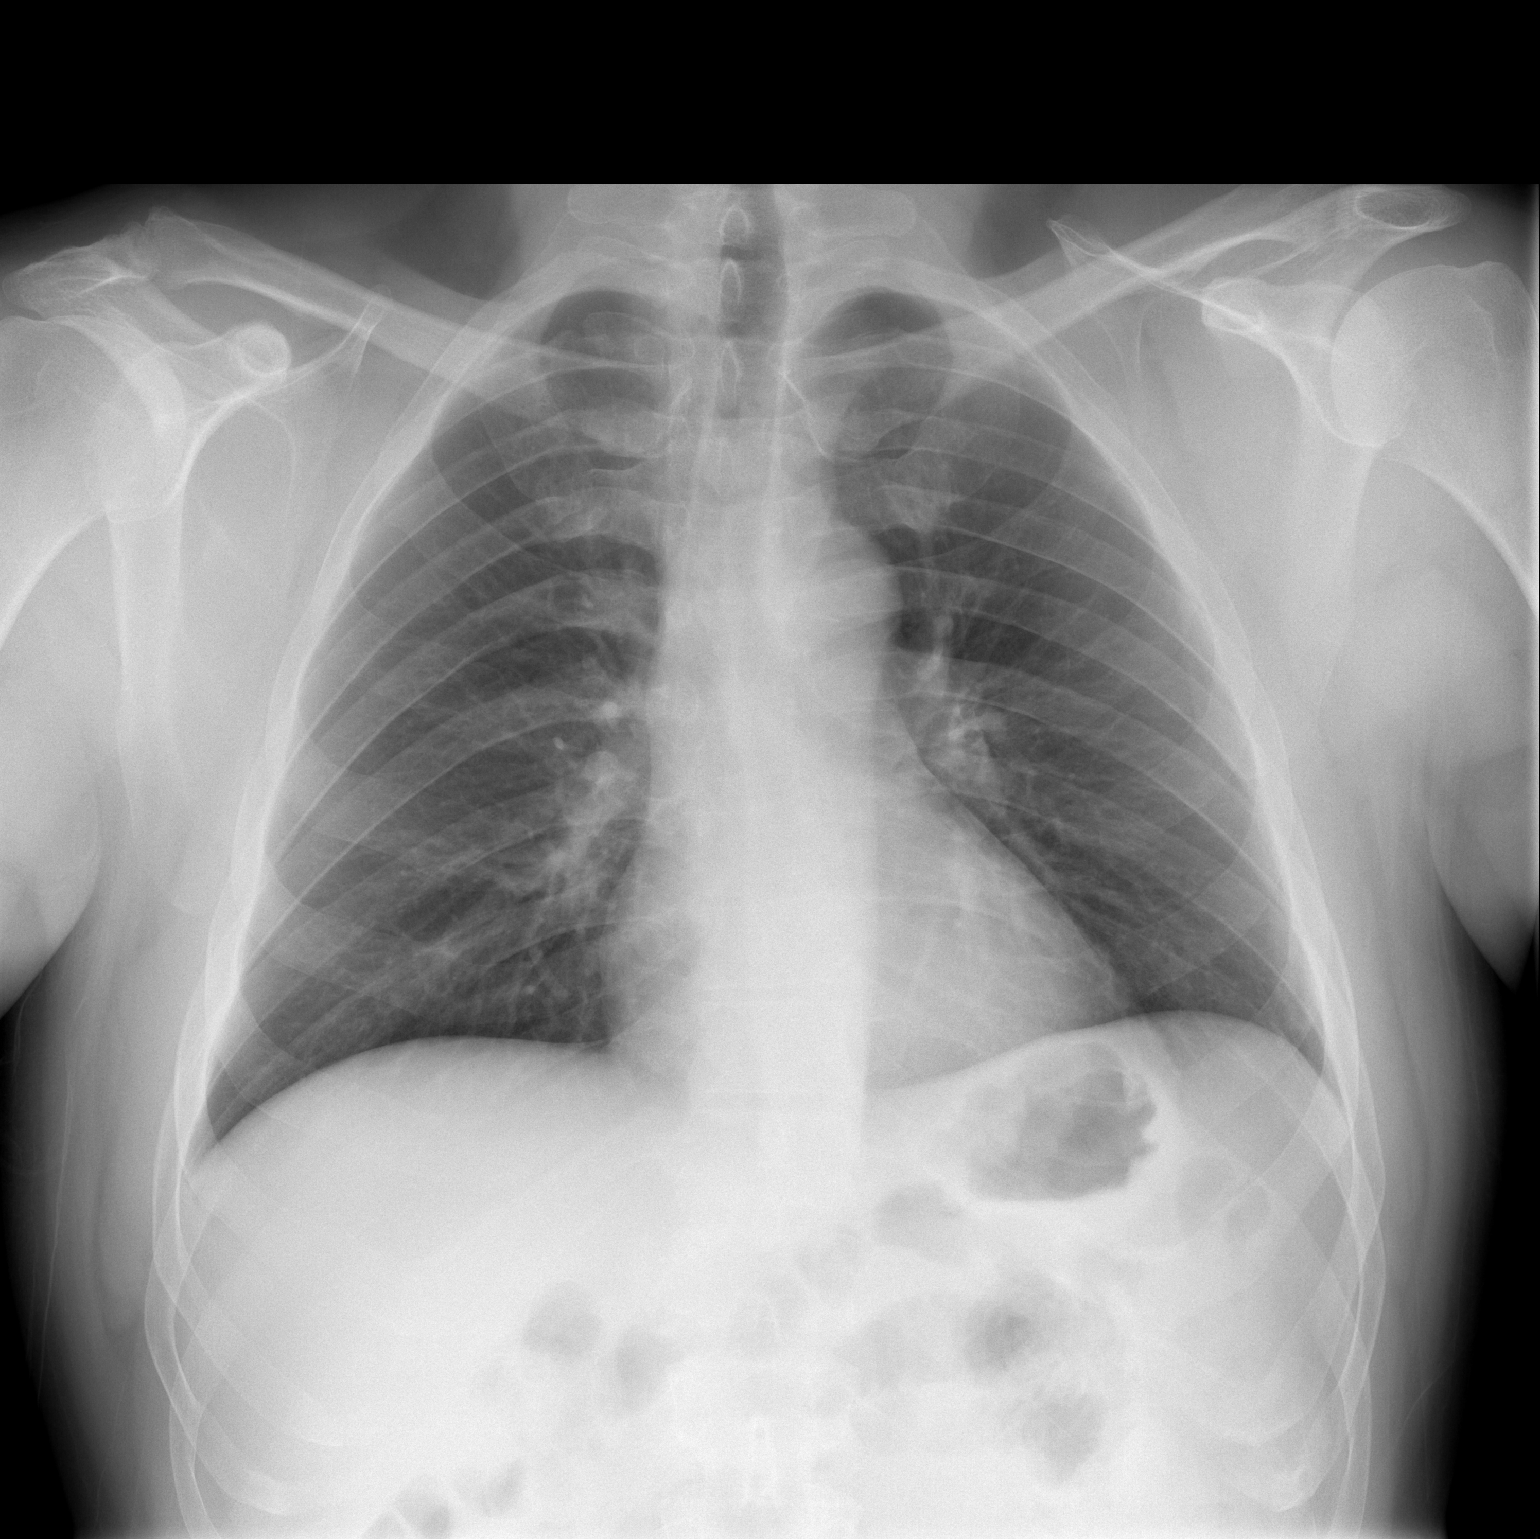

[w chest lat]
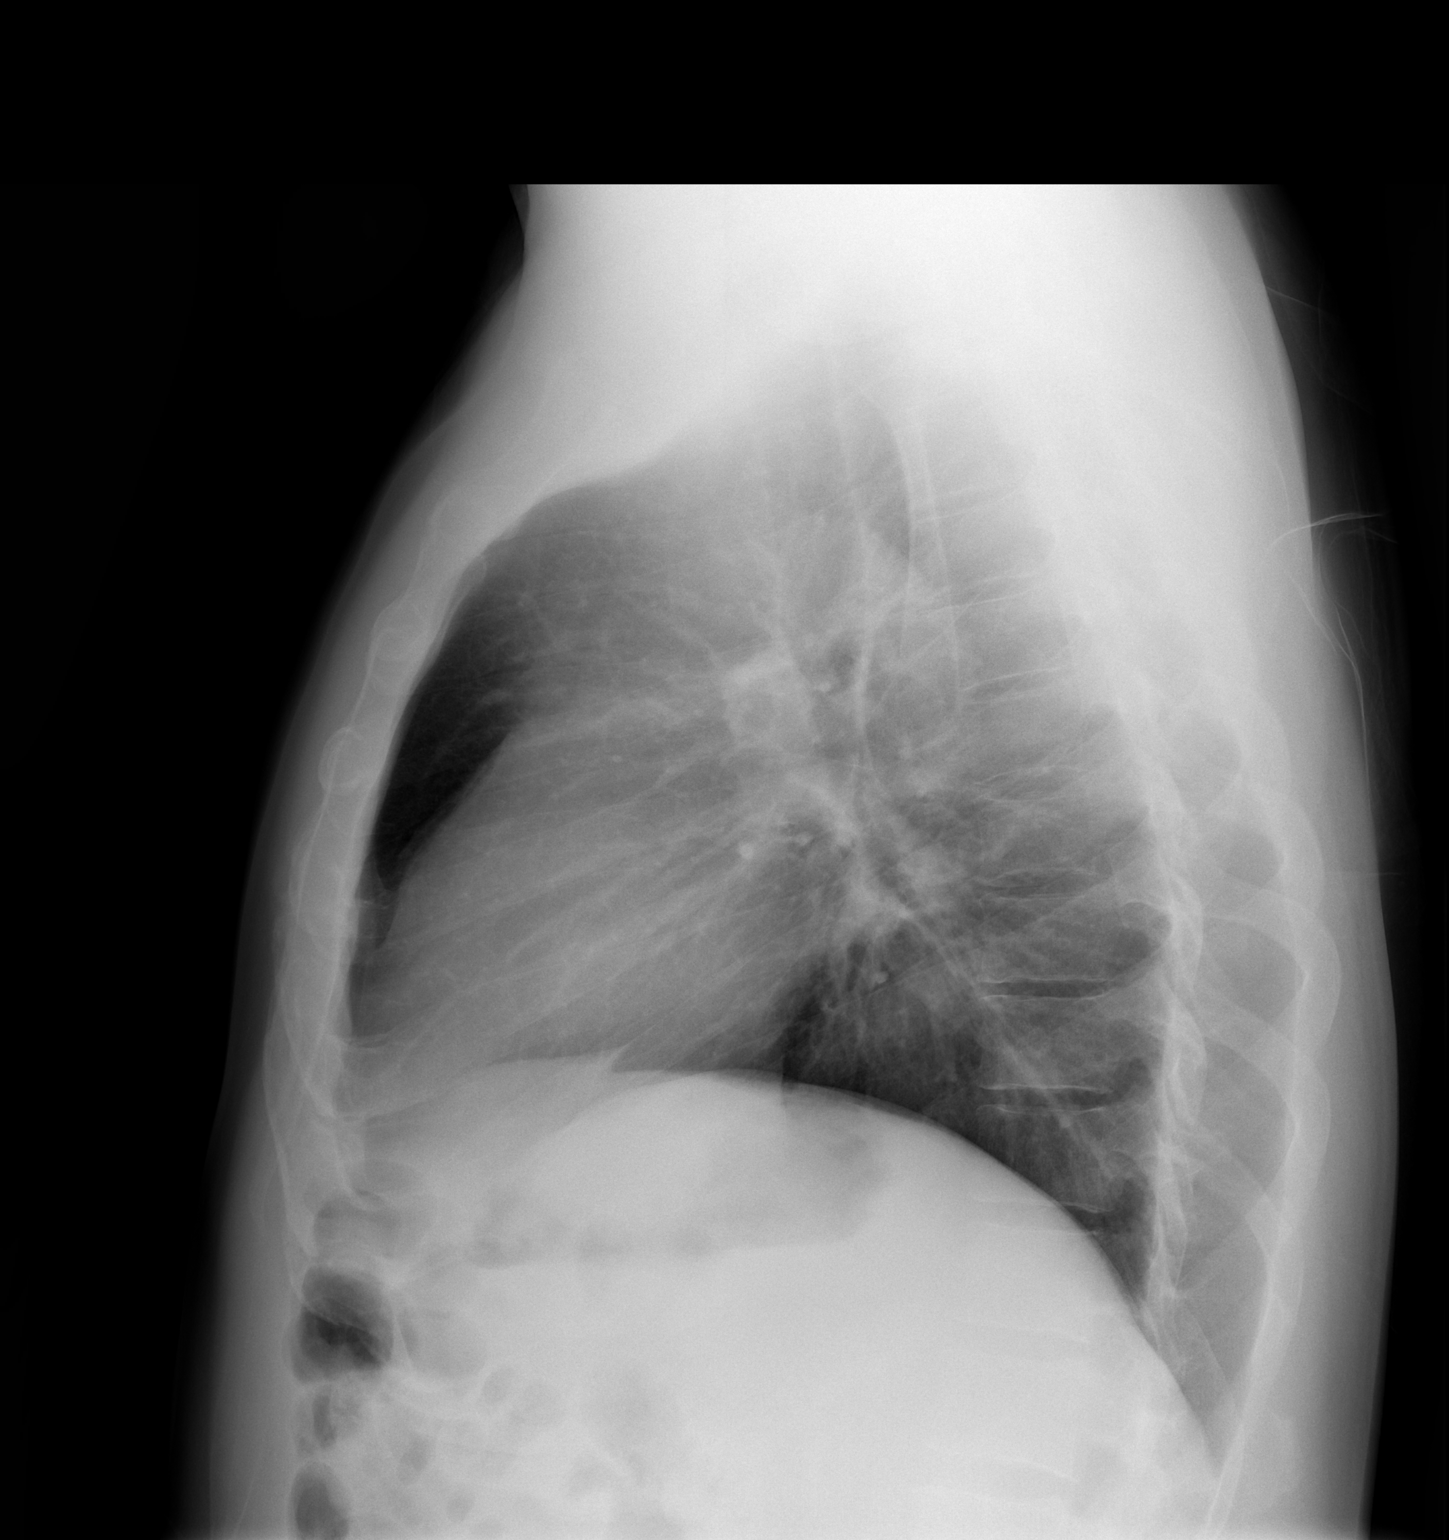

[2 of 2 positions shown; findings below may reference images not displayed]

FINDINGS: Lung volumes and mediastinal contours are within normal limits.
Visualized tracheal air column is within normal limits. Both lungs
appear clear. No pneumothorax or pleural effusion. Negative visible
bowel gas and osseous structures.
IMPRESSION: Negative.  No acute cardiopulmonary abnormality.

## 2018-12-31 DIAGNOSIS — F5104 Psychophysiologic insomnia: Secondary | ICD-10-CM | POA: Diagnosis not present

## 2018-12-31 DIAGNOSIS — M545 Low back pain: Secondary | ICD-10-CM | POA: Diagnosis not present

## 2018-12-31 DIAGNOSIS — E291 Testicular hypofunction: Secondary | ICD-10-CM | POA: Diagnosis not present

## 2018-12-31 DIAGNOSIS — N200 Calculus of kidney: Secondary | ICD-10-CM | POA: Diagnosis not present

## 2019-01-12 DIAGNOSIS — E663 Overweight: Secondary | ICD-10-CM | POA: Diagnosis not present

## 2019-01-12 DIAGNOSIS — M545 Low back pain: Secondary | ICD-10-CM | POA: Diagnosis not present

## 2019-01-12 DIAGNOSIS — F5104 Psychophysiologic insomnia: Secondary | ICD-10-CM | POA: Diagnosis not present

## 2019-01-12 DIAGNOSIS — N200 Calculus of kidney: Secondary | ICD-10-CM | POA: Diagnosis not present

## 2019-01-12 DIAGNOSIS — E291 Testicular hypofunction: Secondary | ICD-10-CM | POA: Diagnosis not present

## 2019-01-21 DIAGNOSIS — M545 Low back pain: Secondary | ICD-10-CM | POA: Diagnosis not present

## 2019-01-21 DIAGNOSIS — F5104 Psychophysiologic insomnia: Secondary | ICD-10-CM | POA: Diagnosis not present

## 2019-01-21 DIAGNOSIS — N200 Calculus of kidney: Secondary | ICD-10-CM | POA: Diagnosis not present

## 2019-01-21 DIAGNOSIS — E291 Testicular hypofunction: Secondary | ICD-10-CM | POA: Diagnosis not present

## 2019-02-09 DIAGNOSIS — E663 Overweight: Secondary | ICD-10-CM | POA: Diagnosis not present

## 2019-02-09 DIAGNOSIS — F5104 Psychophysiologic insomnia: Secondary | ICD-10-CM | POA: Diagnosis not present

## 2019-02-09 DIAGNOSIS — M545 Low back pain: Secondary | ICD-10-CM | POA: Diagnosis not present

## 2019-02-09 DIAGNOSIS — E291 Testicular hypofunction: Secondary | ICD-10-CM | POA: Diagnosis not present

## 2019-02-09 DIAGNOSIS — N200 Calculus of kidney: Secondary | ICD-10-CM | POA: Diagnosis not present

## 2019-02-15 DIAGNOSIS — F5104 Psychophysiologic insomnia: Secondary | ICD-10-CM | POA: Diagnosis not present

## 2019-02-15 DIAGNOSIS — E291 Testicular hypofunction: Secondary | ICD-10-CM | POA: Diagnosis not present

## 2019-02-15 DIAGNOSIS — I10 Essential (primary) hypertension: Secondary | ICD-10-CM | POA: Diagnosis not present

## 2019-02-15 DIAGNOSIS — M545 Low back pain: Secondary | ICD-10-CM | POA: Diagnosis not present

## 2019-03-09 DIAGNOSIS — E291 Testicular hypofunction: Secondary | ICD-10-CM | POA: Diagnosis not present

## 2019-03-09 DIAGNOSIS — M545 Low back pain: Secondary | ICD-10-CM | POA: Diagnosis not present

## 2019-03-09 DIAGNOSIS — M25552 Pain in left hip: Secondary | ICD-10-CM | POA: Diagnosis not present

## 2019-03-09 DIAGNOSIS — F5104 Psychophysiologic insomnia: Secondary | ICD-10-CM | POA: Diagnosis not present

## 2019-04-01 DIAGNOSIS — H1030 Unspecified acute conjunctivitis, unspecified eye: Secondary | ICD-10-CM | POA: Diagnosis not present

## 2019-04-01 DIAGNOSIS — I10 Essential (primary) hypertension: Secondary | ICD-10-CM | POA: Diagnosis not present

## 2019-04-01 DIAGNOSIS — F5104 Psychophysiologic insomnia: Secondary | ICD-10-CM | POA: Diagnosis not present

## 2019-04-01 DIAGNOSIS — E291 Testicular hypofunction: Secondary | ICD-10-CM | POA: Diagnosis not present

## 2019-04-13 DIAGNOSIS — M25552 Pain in left hip: Secondary | ICD-10-CM | POA: Diagnosis not present

## 2019-04-13 DIAGNOSIS — I10 Essential (primary) hypertension: Secondary | ICD-10-CM | POA: Diagnosis not present

## 2019-04-13 DIAGNOSIS — M545 Low back pain: Secondary | ICD-10-CM | POA: Diagnosis not present

## 2019-04-13 DIAGNOSIS — F5104 Psychophysiologic insomnia: Secondary | ICD-10-CM | POA: Diagnosis not present

## 2019-04-13 DIAGNOSIS — N200 Calculus of kidney: Secondary | ICD-10-CM | POA: Diagnosis not present

## 2019-05-11 DIAGNOSIS — E291 Testicular hypofunction: Secondary | ICD-10-CM | POA: Diagnosis not present

## 2019-05-11 DIAGNOSIS — G8929 Other chronic pain: Secondary | ICD-10-CM | POA: Diagnosis not present

## 2019-05-11 DIAGNOSIS — E663 Overweight: Secondary | ICD-10-CM | POA: Diagnosis not present

## 2019-05-11 DIAGNOSIS — F5104 Psychophysiologic insomnia: Secondary | ICD-10-CM | POA: Diagnosis not present

## 2019-05-11 DIAGNOSIS — M25552 Pain in left hip: Secondary | ICD-10-CM | POA: Diagnosis not present

## 2019-05-20 DIAGNOSIS — S299XXA Unspecified injury of thorax, initial encounter: Secondary | ICD-10-CM | POA: Diagnosis not present

## 2019-05-20 DIAGNOSIS — S59902A Unspecified injury of left elbow, initial encounter: Secondary | ICD-10-CM | POA: Diagnosis not present

## 2019-05-20 DIAGNOSIS — M79602 Pain in left arm: Secondary | ICD-10-CM | POA: Diagnosis not present

## 2019-05-20 DIAGNOSIS — S59912A Unspecified injury of left forearm, initial encounter: Secondary | ICD-10-CM | POA: Diagnosis not present

## 2019-05-20 DIAGNOSIS — M79632 Pain in left forearm: Secondary | ICD-10-CM | POA: Diagnosis not present

## 2019-05-20 DIAGNOSIS — M545 Low back pain: Secondary | ICD-10-CM | POA: Diagnosis not present

## 2019-05-20 DIAGNOSIS — M25522 Pain in left elbow: Secondary | ICD-10-CM | POA: Diagnosis not present

## 2019-05-20 DIAGNOSIS — R0781 Pleurodynia: Secondary | ICD-10-CM | POA: Diagnosis not present

## 2019-05-20 DIAGNOSIS — S3992XA Unspecified injury of lower back, initial encounter: Secondary | ICD-10-CM | POA: Diagnosis not present

## 2019-05-28 DIAGNOSIS — F419 Anxiety disorder, unspecified: Secondary | ICD-10-CM | POA: Diagnosis not present

## 2019-05-28 DIAGNOSIS — F5104 Psychophysiologic insomnia: Secondary | ICD-10-CM | POA: Diagnosis not present

## 2019-05-28 DIAGNOSIS — E291 Testicular hypofunction: Secondary | ICD-10-CM | POA: Diagnosis not present

## 2019-05-28 DIAGNOSIS — I10 Essential (primary) hypertension: Secondary | ICD-10-CM | POA: Diagnosis not present

## 2019-05-30 DIAGNOSIS — E291 Testicular hypofunction: Secondary | ICD-10-CM | POA: Diagnosis not present

## 2019-05-30 DIAGNOSIS — F5104 Psychophysiologic insomnia: Secondary | ICD-10-CM | POA: Diagnosis not present

## 2019-05-30 DIAGNOSIS — N200 Calculus of kidney: Secondary | ICD-10-CM | POA: Diagnosis not present

## 2019-05-30 DIAGNOSIS — M25552 Pain in left hip: Secondary | ICD-10-CM | POA: Diagnosis not present

## 2019-05-31 DIAGNOSIS — X58XXXA Exposure to other specified factors, initial encounter: Secondary | ICD-10-CM | POA: Diagnosis not present

## 2019-05-31 DIAGNOSIS — S3992XA Unspecified injury of lower back, initial encounter: Secondary | ICD-10-CM | POA: Diagnosis not present

## 2019-05-31 DIAGNOSIS — S39012A Strain of muscle, fascia and tendon of lower back, initial encounter: Secondary | ICD-10-CM | POA: Diagnosis not present

## 2019-05-31 DIAGNOSIS — Y999 Unspecified external cause status: Secondary | ICD-10-CM | POA: Diagnosis not present

## 2019-06-12 DIAGNOSIS — G8929 Other chronic pain: Secondary | ICD-10-CM | POA: Diagnosis not present

## 2019-06-12 DIAGNOSIS — E291 Testicular hypofunction: Secondary | ICD-10-CM | POA: Diagnosis not present

## 2019-06-12 DIAGNOSIS — M545 Low back pain: Secondary | ICD-10-CM | POA: Diagnosis not present

## 2019-06-12 DIAGNOSIS — F5104 Psychophysiologic insomnia: Secondary | ICD-10-CM | POA: Diagnosis not present

## 2019-06-20 DIAGNOSIS — M545 Low back pain: Secondary | ICD-10-CM | POA: Diagnosis not present

## 2019-06-20 DIAGNOSIS — M5416 Radiculopathy, lumbar region: Secondary | ICD-10-CM | POA: Diagnosis not present

## 2019-06-25 DIAGNOSIS — R1031 Right lower quadrant pain: Secondary | ICD-10-CM | POA: Diagnosis not present

## 2019-06-25 DIAGNOSIS — R109 Unspecified abdominal pain: Secondary | ICD-10-CM | POA: Diagnosis not present

## 2019-06-25 DIAGNOSIS — R1032 Left lower quadrant pain: Secondary | ICD-10-CM | POA: Diagnosis not present

## 2019-07-02 DIAGNOSIS — N200 Calculus of kidney: Secondary | ICD-10-CM | POA: Diagnosis not present

## 2019-07-02 DIAGNOSIS — F5104 Psychophysiologic insomnia: Secondary | ICD-10-CM | POA: Diagnosis not present

## 2019-07-02 DIAGNOSIS — I10 Essential (primary) hypertension: Secondary | ICD-10-CM | POA: Diagnosis not present

## 2019-07-02 DIAGNOSIS — E291 Testicular hypofunction: Secondary | ICD-10-CM | POA: Diagnosis not present

## 2019-07-05 DIAGNOSIS — R109 Unspecified abdominal pain: Secondary | ICD-10-CM | POA: Diagnosis not present

## 2019-07-05 DIAGNOSIS — R1084 Generalized abdominal pain: Secondary | ICD-10-CM | POA: Diagnosis not present

## 2019-07-15 DIAGNOSIS — E291 Testicular hypofunction: Secondary | ICD-10-CM | POA: Diagnosis not present

## 2019-07-15 DIAGNOSIS — M545 Low back pain: Secondary | ICD-10-CM | POA: Diagnosis not present

## 2019-07-15 DIAGNOSIS — Z23 Encounter for immunization: Secondary | ICD-10-CM | POA: Diagnosis not present

## 2019-07-15 DIAGNOSIS — M25552 Pain in left hip: Secondary | ICD-10-CM | POA: Diagnosis not present

## 2019-07-15 DIAGNOSIS — I10 Essential (primary) hypertension: Secondary | ICD-10-CM | POA: Diagnosis not present

## 2019-07-15 DIAGNOSIS — F5104 Psychophysiologic insomnia: Secondary | ICD-10-CM | POA: Diagnosis not present

## 2019-08-03 DIAGNOSIS — E663 Overweight: Secondary | ICD-10-CM | POA: Diagnosis not present

## 2019-08-03 DIAGNOSIS — F5104 Psychophysiologic insomnia: Secondary | ICD-10-CM | POA: Diagnosis not present

## 2019-08-03 DIAGNOSIS — I10 Essential (primary) hypertension: Secondary | ICD-10-CM | POA: Diagnosis not present

## 2019-08-03 DIAGNOSIS — E291 Testicular hypofunction: Secondary | ICD-10-CM | POA: Diagnosis not present

## 2019-08-03 DIAGNOSIS — F419 Anxiety disorder, unspecified: Secondary | ICD-10-CM | POA: Diagnosis not present

## 2019-08-31 DIAGNOSIS — E291 Testicular hypofunction: Secondary | ICD-10-CM | POA: Diagnosis not present

## 2019-08-31 DIAGNOSIS — N200 Calculus of kidney: Secondary | ICD-10-CM | POA: Diagnosis not present

## 2019-08-31 DIAGNOSIS — I10 Essential (primary) hypertension: Secondary | ICD-10-CM | POA: Diagnosis not present

## 2019-08-31 DIAGNOSIS — F5104 Psychophysiologic insomnia: Secondary | ICD-10-CM | POA: Diagnosis not present

## 2019-09-18 DIAGNOSIS — N200 Calculus of kidney: Secondary | ICD-10-CM | POA: Diagnosis not present

## 2019-09-18 DIAGNOSIS — Z20828 Contact with and (suspected) exposure to other viral communicable diseases: Secondary | ICD-10-CM | POA: Diagnosis not present

## 2019-09-18 DIAGNOSIS — F5104 Psychophysiologic insomnia: Secondary | ICD-10-CM | POA: Diagnosis not present

## 2019-09-18 DIAGNOSIS — R197 Diarrhea, unspecified: Secondary | ICD-10-CM | POA: Diagnosis not present

## 2019-09-18 DIAGNOSIS — R5383 Other fatigue: Secondary | ICD-10-CM | POA: Diagnosis not present

## 2019-09-26 DIAGNOSIS — N201 Calculus of ureter: Secondary | ICD-10-CM | POA: Diagnosis not present

## 2019-09-26 DIAGNOSIS — R112 Nausea with vomiting, unspecified: Secondary | ICD-10-CM | POA: Diagnosis not present

## 2019-09-26 DIAGNOSIS — Z79899 Other long term (current) drug therapy: Secondary | ICD-10-CM | POA: Diagnosis not present

## 2019-09-26 DIAGNOSIS — I10 Essential (primary) hypertension: Secondary | ICD-10-CM | POA: Diagnosis not present

## 2019-09-26 DIAGNOSIS — N179 Acute kidney failure, unspecified: Secondary | ICD-10-CM | POA: Diagnosis not present

## 2019-09-26 DIAGNOSIS — R109 Unspecified abdominal pain: Secondary | ICD-10-CM | POA: Diagnosis not present

## 2019-09-26 DIAGNOSIS — N1330 Unspecified hydronephrosis: Secondary | ICD-10-CM | POA: Diagnosis not present

## 2019-09-27 DIAGNOSIS — N179 Acute kidney failure, unspecified: Secondary | ICD-10-CM

## 2019-09-27 DIAGNOSIS — I10 Essential (primary) hypertension: Secondary | ICD-10-CM | POA: Diagnosis not present

## 2019-09-27 DIAGNOSIS — N201 Calculus of ureter: Secondary | ICD-10-CM | POA: Diagnosis not present

## 2019-09-27 DIAGNOSIS — N209 Urinary calculus, unspecified: Secondary | ICD-10-CM | POA: Diagnosis not present

## 2019-09-27 DIAGNOSIS — N1330 Unspecified hydronephrosis: Secondary | ICD-10-CM

## 2019-09-27 DIAGNOSIS — R112 Nausea with vomiting, unspecified: Secondary | ICD-10-CM

## 2019-09-27 DIAGNOSIS — N132 Hydronephrosis with renal and ureteral calculous obstruction: Secondary | ICD-10-CM | POA: Diagnosis not present

## 2019-09-30 DIAGNOSIS — R319 Hematuria, unspecified: Secondary | ICD-10-CM | POA: Diagnosis not present

## 2019-10-01 DIAGNOSIS — F5104 Psychophysiologic insomnia: Secondary | ICD-10-CM | POA: Diagnosis not present

## 2019-10-01 DIAGNOSIS — N200 Calculus of kidney: Secondary | ICD-10-CM | POA: Diagnosis not present

## 2019-10-01 DIAGNOSIS — E291 Testicular hypofunction: Secondary | ICD-10-CM | POA: Diagnosis not present

## 2019-10-01 DIAGNOSIS — I10 Essential (primary) hypertension: Secondary | ICD-10-CM | POA: Diagnosis not present

## 2019-10-07 DIAGNOSIS — N201 Calculus of ureter: Secondary | ICD-10-CM | POA: Diagnosis not present

## 2019-10-07 DIAGNOSIS — N3020 Other chronic cystitis without hematuria: Secondary | ICD-10-CM | POA: Diagnosis not present

## 2019-10-29 DIAGNOSIS — F5104 Psychophysiologic insomnia: Secondary | ICD-10-CM | POA: Diagnosis not present

## 2019-10-29 DIAGNOSIS — E291 Testicular hypofunction: Secondary | ICD-10-CM | POA: Diagnosis not present

## 2019-10-29 DIAGNOSIS — F419 Anxiety disorder, unspecified: Secondary | ICD-10-CM | POA: Diagnosis not present

## 2019-10-29 DIAGNOSIS — I10 Essential (primary) hypertension: Secondary | ICD-10-CM | POA: Diagnosis not present

## 2019-11-26 DIAGNOSIS — I10 Essential (primary) hypertension: Secondary | ICD-10-CM | POA: Diagnosis not present

## 2019-11-26 DIAGNOSIS — F5104 Psychophysiologic insomnia: Secondary | ICD-10-CM | POA: Diagnosis not present

## 2019-11-26 DIAGNOSIS — E291 Testicular hypofunction: Secondary | ICD-10-CM | POA: Diagnosis not present

## 2019-11-26 DIAGNOSIS — M25552 Pain in left hip: Secondary | ICD-10-CM | POA: Diagnosis not present

## 2019-12-13 DIAGNOSIS — F5104 Psychophysiologic insomnia: Secondary | ICD-10-CM | POA: Diagnosis not present

## 2019-12-13 DIAGNOSIS — E291 Testicular hypofunction: Secondary | ICD-10-CM | POA: Diagnosis not present

## 2019-12-13 DIAGNOSIS — N200 Calculus of kidney: Secondary | ICD-10-CM | POA: Diagnosis not present

## 2019-12-13 DIAGNOSIS — F419 Anxiety disorder, unspecified: Secondary | ICD-10-CM | POA: Diagnosis not present

## 2019-12-20 DIAGNOSIS — Z79899 Other long term (current) drug therapy: Secondary | ICD-10-CM | POA: Diagnosis not present

## 2019-12-20 DIAGNOSIS — R1031 Right lower quadrant pain: Secondary | ICD-10-CM | POA: Diagnosis not present

## 2020-01-20 DIAGNOSIS — M545 Low back pain: Secondary | ICD-10-CM | POA: Diagnosis not present

## 2020-01-20 DIAGNOSIS — F5104 Psychophysiologic insomnia: Secondary | ICD-10-CM | POA: Diagnosis not present

## 2020-01-20 DIAGNOSIS — F419 Anxiety disorder, unspecified: Secondary | ICD-10-CM | POA: Diagnosis not present

## 2020-01-20 DIAGNOSIS — E291 Testicular hypofunction: Secondary | ICD-10-CM | POA: Diagnosis not present

## 2020-02-14 DIAGNOSIS — M545 Low back pain: Secondary | ICD-10-CM | POA: Diagnosis not present

## 2020-02-14 DIAGNOSIS — E291 Testicular hypofunction: Secondary | ICD-10-CM | POA: Diagnosis not present

## 2020-02-14 DIAGNOSIS — R5382 Chronic fatigue, unspecified: Secondary | ICD-10-CM | POA: Diagnosis not present

## 2020-02-14 DIAGNOSIS — F5104 Psychophysiologic insomnia: Secondary | ICD-10-CM | POA: Diagnosis not present

## 2020-02-21 DIAGNOSIS — M545 Low back pain: Secondary | ICD-10-CM | POA: Diagnosis not present

## 2020-02-21 DIAGNOSIS — F419 Anxiety disorder, unspecified: Secondary | ICD-10-CM | POA: Diagnosis not present

## 2020-02-21 DIAGNOSIS — N200 Calculus of kidney: Secondary | ICD-10-CM | POA: Diagnosis not present

## 2020-02-21 DIAGNOSIS — E291 Testicular hypofunction: Secondary | ICD-10-CM | POA: Diagnosis not present

## 2020-03-13 DIAGNOSIS — E291 Testicular hypofunction: Secondary | ICD-10-CM | POA: Diagnosis not present

## 2020-03-13 DIAGNOSIS — I10 Essential (primary) hypertension: Secondary | ICD-10-CM | POA: Diagnosis not present

## 2020-03-13 DIAGNOSIS — G8929 Other chronic pain: Secondary | ICD-10-CM | POA: Diagnosis not present

## 2020-03-13 DIAGNOSIS — F419 Anxiety disorder, unspecified: Secondary | ICD-10-CM | POA: Diagnosis not present

## 2020-03-13 DIAGNOSIS — M545 Low back pain: Secondary | ICD-10-CM | POA: Diagnosis not present

## 2020-04-17 DIAGNOSIS — F419 Anxiety disorder, unspecified: Secondary | ICD-10-CM | POA: Diagnosis not present

## 2020-04-17 DIAGNOSIS — M545 Low back pain: Secondary | ICD-10-CM | POA: Diagnosis not present

## 2020-04-17 DIAGNOSIS — G8929 Other chronic pain: Secondary | ICD-10-CM | POA: Diagnosis not present

## 2020-04-17 DIAGNOSIS — E291 Testicular hypofunction: Secondary | ICD-10-CM | POA: Diagnosis not present

## 2020-05-15 DIAGNOSIS — F419 Anxiety disorder, unspecified: Secondary | ICD-10-CM | POA: Diagnosis not present

## 2020-05-15 DIAGNOSIS — E291 Testicular hypofunction: Secondary | ICD-10-CM | POA: Diagnosis not present

## 2020-05-15 DIAGNOSIS — G8929 Other chronic pain: Secondary | ICD-10-CM | POA: Diagnosis not present

## 2020-05-15 DIAGNOSIS — M545 Low back pain: Secondary | ICD-10-CM | POA: Diagnosis not present

## 2020-06-12 DIAGNOSIS — Z20822 Contact with and (suspected) exposure to covid-19: Secondary | ICD-10-CM | POA: Diagnosis not present

## 2020-06-17 DIAGNOSIS — R0602 Shortness of breath: Secondary | ICD-10-CM | POA: Diagnosis not present

## 2020-06-17 DIAGNOSIS — R079 Chest pain, unspecified: Secondary | ICD-10-CM | POA: Diagnosis not present

## 2020-06-17 DIAGNOSIS — K5730 Diverticulosis of large intestine without perforation or abscess without bleeding: Secondary | ICD-10-CM | POA: Diagnosis not present

## 2020-06-17 DIAGNOSIS — R0789 Other chest pain: Secondary | ICD-10-CM | POA: Diagnosis not present

## 2020-06-17 DIAGNOSIS — N200 Calculus of kidney: Secondary | ICD-10-CM | POA: Diagnosis not present

## 2020-06-26 DIAGNOSIS — F419 Anxiety disorder, unspecified: Secondary | ICD-10-CM | POA: Diagnosis not present

## 2020-06-26 DIAGNOSIS — E291 Testicular hypofunction: Secondary | ICD-10-CM | POA: Diagnosis not present

## 2020-06-26 DIAGNOSIS — M545 Low back pain: Secondary | ICD-10-CM | POA: Diagnosis not present

## 2020-06-26 DIAGNOSIS — N200 Calculus of kidney: Secondary | ICD-10-CM | POA: Diagnosis not present

## 2020-07-24 DIAGNOSIS — I10 Essential (primary) hypertension: Secondary | ICD-10-CM | POA: Diagnosis not present

## 2020-07-24 DIAGNOSIS — N200 Calculus of kidney: Secondary | ICD-10-CM | POA: Diagnosis not present

## 2020-07-24 DIAGNOSIS — E291 Testicular hypofunction: Secondary | ICD-10-CM | POA: Diagnosis not present

## 2020-07-24 DIAGNOSIS — M79671 Pain in right foot: Secondary | ICD-10-CM | POA: Diagnosis not present

## 2020-07-24 DIAGNOSIS — F5104 Psychophysiologic insomnia: Secondary | ICD-10-CM | POA: Diagnosis not present

## 2020-07-24 DIAGNOSIS — G8929 Other chronic pain: Secondary | ICD-10-CM | POA: Diagnosis not present

## 2020-08-21 DIAGNOSIS — E291 Testicular hypofunction: Secondary | ICD-10-CM | POA: Diagnosis not present

## 2020-08-21 DIAGNOSIS — G8929 Other chronic pain: Secondary | ICD-10-CM | POA: Diagnosis not present

## 2020-08-21 DIAGNOSIS — I10 Essential (primary) hypertension: Secondary | ICD-10-CM | POA: Diagnosis not present

## 2020-08-21 DIAGNOSIS — M545 Low back pain, unspecified: Secondary | ICD-10-CM | POA: Diagnosis not present

## 2020-09-05 DIAGNOSIS — N200 Calculus of kidney: Secondary | ICD-10-CM | POA: Diagnosis not present

## 2020-09-05 DIAGNOSIS — N179 Acute kidney failure, unspecified: Secondary | ICD-10-CM | POA: Diagnosis not present

## 2020-09-05 DIAGNOSIS — Z79899 Other long term (current) drug therapy: Secondary | ICD-10-CM | POA: Diagnosis not present

## 2020-09-05 DIAGNOSIS — K279 Peptic ulcer, site unspecified, unspecified as acute or chronic, without hemorrhage or perforation: Secondary | ICD-10-CM | POA: Diagnosis not present

## 2020-09-05 DIAGNOSIS — I10 Essential (primary) hypertension: Secondary | ICD-10-CM | POA: Diagnosis not present

## 2020-09-05 DIAGNOSIS — M4182 Other forms of scoliosis, cervical region: Secondary | ICD-10-CM | POA: Diagnosis not present

## 2020-09-05 DIAGNOSIS — R109 Unspecified abdominal pain: Secondary | ICD-10-CM | POA: Diagnosis not present

## 2020-09-05 DIAGNOSIS — K5730 Diverticulosis of large intestine without perforation or abscess without bleeding: Secondary | ICD-10-CM | POA: Diagnosis not present

## 2020-09-05 DIAGNOSIS — G589 Mononeuropathy, unspecified: Secondary | ICD-10-CM | POA: Diagnosis not present

## 2020-09-05 DIAGNOSIS — N1330 Unspecified hydronephrosis: Secondary | ICD-10-CM | POA: Diagnosis not present

## 2020-09-05 DIAGNOSIS — G629 Polyneuropathy, unspecified: Secondary | ICD-10-CM | POA: Diagnosis not present

## 2020-09-05 DIAGNOSIS — R200 Anesthesia of skin: Secondary | ICD-10-CM | POA: Diagnosis not present

## 2020-09-08 DIAGNOSIS — E291 Testicular hypofunction: Secondary | ICD-10-CM | POA: Diagnosis not present

## 2020-09-08 DIAGNOSIS — K219 Gastro-esophageal reflux disease without esophagitis: Secondary | ICD-10-CM | POA: Diagnosis not present

## 2020-09-08 DIAGNOSIS — I10 Essential (primary) hypertension: Secondary | ICD-10-CM | POA: Diagnosis not present

## 2020-09-08 DIAGNOSIS — N200 Calculus of kidney: Secondary | ICD-10-CM | POA: Diagnosis not present

## 2020-09-24 DIAGNOSIS — E291 Testicular hypofunction: Secondary | ICD-10-CM | POA: Diagnosis not present

## 2020-09-24 DIAGNOSIS — I10 Essential (primary) hypertension: Secondary | ICD-10-CM | POA: Diagnosis not present

## 2020-09-24 DIAGNOSIS — N200 Calculus of kidney: Secondary | ICD-10-CM | POA: Diagnosis not present

## 2020-09-24 DIAGNOSIS — K219 Gastro-esophageal reflux disease without esophagitis: Secondary | ICD-10-CM | POA: Diagnosis not present

## 2020-10-07 DIAGNOSIS — G8929 Other chronic pain: Secondary | ICD-10-CM | POA: Diagnosis not present

## 2020-10-07 DIAGNOSIS — I10 Essential (primary) hypertension: Secondary | ICD-10-CM | POA: Diagnosis not present

## 2020-10-07 DIAGNOSIS — M25522 Pain in left elbow: Secondary | ICD-10-CM | POA: Diagnosis not present

## 2020-10-07 DIAGNOSIS — M545 Low back pain, unspecified: Secondary | ICD-10-CM | POA: Diagnosis not present

## 2020-10-22 DIAGNOSIS — K219 Gastro-esophageal reflux disease without esophagitis: Secondary | ICD-10-CM | POA: Diagnosis not present

## 2020-10-22 DIAGNOSIS — Z1331 Encounter for screening for depression: Secondary | ICD-10-CM | POA: Diagnosis not present

## 2020-10-22 DIAGNOSIS — I10 Essential (primary) hypertension: Secondary | ICD-10-CM | POA: Diagnosis not present

## 2020-10-22 DIAGNOSIS — E291 Testicular hypofunction: Secondary | ICD-10-CM | POA: Diagnosis not present

## 2020-10-22 DIAGNOSIS — N200 Calculus of kidney: Secondary | ICD-10-CM | POA: Diagnosis not present

## 2020-11-16 DIAGNOSIS — R059 Cough, unspecified: Secondary | ICD-10-CM | POA: Diagnosis not present

## 2020-11-16 DIAGNOSIS — R197 Diarrhea, unspecified: Secondary | ICD-10-CM | POA: Diagnosis not present

## 2020-11-16 DIAGNOSIS — Z20822 Contact with and (suspected) exposure to covid-19: Secondary | ICD-10-CM | POA: Diagnosis not present

## 2020-11-16 DIAGNOSIS — U071 COVID-19: Secondary | ICD-10-CM | POA: Diagnosis not present

## 2020-11-20 DIAGNOSIS — E291 Testicular hypofunction: Secondary | ICD-10-CM | POA: Diagnosis not present

## 2020-11-20 DIAGNOSIS — I10 Essential (primary) hypertension: Secondary | ICD-10-CM | POA: Diagnosis not present

## 2020-11-20 DIAGNOSIS — N200 Calculus of kidney: Secondary | ICD-10-CM | POA: Diagnosis not present

## 2020-11-20 DIAGNOSIS — K219 Gastro-esophageal reflux disease without esophagitis: Secondary | ICD-10-CM | POA: Diagnosis not present

## 2020-12-18 DIAGNOSIS — K219 Gastro-esophageal reflux disease without esophagitis: Secondary | ICD-10-CM | POA: Diagnosis not present

## 2020-12-18 DIAGNOSIS — I10 Essential (primary) hypertension: Secondary | ICD-10-CM | POA: Diagnosis not present

## 2020-12-18 DIAGNOSIS — N200 Calculus of kidney: Secondary | ICD-10-CM | POA: Diagnosis not present

## 2020-12-18 DIAGNOSIS — E291 Testicular hypofunction: Secondary | ICD-10-CM | POA: Diagnosis not present

## 2021-01-04 DIAGNOSIS — R1310 Dysphagia, unspecified: Secondary | ICD-10-CM | POA: Diagnosis not present

## 2021-01-04 DIAGNOSIS — R0602 Shortness of breath: Secondary | ICD-10-CM | POA: Diagnosis not present

## 2021-01-04 DIAGNOSIS — K760 Fatty (change of) liver, not elsewhere classified: Secondary | ICD-10-CM | POA: Diagnosis not present

## 2021-01-04 DIAGNOSIS — R109 Unspecified abdominal pain: Secondary | ICD-10-CM | POA: Diagnosis not present

## 2021-01-04 DIAGNOSIS — R1011 Right upper quadrant pain: Secondary | ICD-10-CM | POA: Diagnosis not present

## 2021-01-04 DIAGNOSIS — R0789 Other chest pain: Secondary | ICD-10-CM | POA: Diagnosis not present

## 2021-01-04 DIAGNOSIS — K209 Esophagitis, unspecified without bleeding: Secondary | ICD-10-CM | POA: Diagnosis not present

## 2021-01-04 DIAGNOSIS — N1330 Unspecified hydronephrosis: Secondary | ICD-10-CM | POA: Diagnosis not present

## 2021-01-04 DIAGNOSIS — R079 Chest pain, unspecified: Secondary | ICD-10-CM | POA: Diagnosis not present

## 2021-01-11 DIAGNOSIS — N200 Calculus of kidney: Secondary | ICD-10-CM | POA: Diagnosis not present

## 2021-01-11 DIAGNOSIS — K219 Gastro-esophageal reflux disease without esophagitis: Secondary | ICD-10-CM | POA: Diagnosis not present

## 2021-01-11 DIAGNOSIS — I10 Essential (primary) hypertension: Secondary | ICD-10-CM | POA: Diagnosis not present

## 2021-01-11 DIAGNOSIS — E291 Testicular hypofunction: Secondary | ICD-10-CM | POA: Diagnosis not present

## 2021-01-29 DIAGNOSIS — I10 Essential (primary) hypertension: Secondary | ICD-10-CM | POA: Diagnosis not present

## 2021-01-29 DIAGNOSIS — E291 Testicular hypofunction: Secondary | ICD-10-CM | POA: Diagnosis not present

## 2021-01-29 DIAGNOSIS — K219 Gastro-esophageal reflux disease without esophagitis: Secondary | ICD-10-CM | POA: Diagnosis not present

## 2021-01-29 DIAGNOSIS — N200 Calculus of kidney: Secondary | ICD-10-CM | POA: Diagnosis not present

## 2021-02-12 DIAGNOSIS — I10 Essential (primary) hypertension: Secondary | ICD-10-CM | POA: Diagnosis not present

## 2021-02-12 DIAGNOSIS — K219 Gastro-esophageal reflux disease without esophagitis: Secondary | ICD-10-CM | POA: Diagnosis not present

## 2021-02-12 DIAGNOSIS — N200 Calculus of kidney: Secondary | ICD-10-CM | POA: Diagnosis not present

## 2021-02-12 DIAGNOSIS — E291 Testicular hypofunction: Secondary | ICD-10-CM | POA: Diagnosis not present

## 2021-03-12 DIAGNOSIS — I10 Essential (primary) hypertension: Secondary | ICD-10-CM | POA: Diagnosis not present

## 2021-03-12 DIAGNOSIS — K219 Gastro-esophageal reflux disease without esophagitis: Secondary | ICD-10-CM | POA: Diagnosis not present

## 2021-03-12 DIAGNOSIS — N200 Calculus of kidney: Secondary | ICD-10-CM | POA: Diagnosis not present

## 2021-03-12 DIAGNOSIS — E291 Testicular hypofunction: Secondary | ICD-10-CM | POA: Diagnosis not present

## 2021-04-09 DIAGNOSIS — I10 Essential (primary) hypertension: Secondary | ICD-10-CM | POA: Diagnosis not present

## 2021-04-09 DIAGNOSIS — N200 Calculus of kidney: Secondary | ICD-10-CM | POA: Diagnosis not present

## 2021-04-09 DIAGNOSIS — K219 Gastro-esophageal reflux disease without esophagitis: Secondary | ICD-10-CM | POA: Diagnosis not present

## 2021-04-09 DIAGNOSIS — E291 Testicular hypofunction: Secondary | ICD-10-CM | POA: Diagnosis not present

## 2021-05-07 DIAGNOSIS — I10 Essential (primary) hypertension: Secondary | ICD-10-CM | POA: Diagnosis not present

## 2021-05-07 DIAGNOSIS — E291 Testicular hypofunction: Secondary | ICD-10-CM | POA: Diagnosis not present

## 2021-05-07 DIAGNOSIS — K219 Gastro-esophageal reflux disease without esophagitis: Secondary | ICD-10-CM | POA: Diagnosis not present

## 2021-05-07 DIAGNOSIS — N200 Calculus of kidney: Secondary | ICD-10-CM | POA: Diagnosis not present

## 2021-06-10 DIAGNOSIS — I10 Essential (primary) hypertension: Secondary | ICD-10-CM | POA: Diagnosis not present

## 2021-06-10 DIAGNOSIS — G8929 Other chronic pain: Secondary | ICD-10-CM | POA: Diagnosis not present

## 2021-06-10 DIAGNOSIS — N200 Calculus of kidney: Secondary | ICD-10-CM | POA: Diagnosis not present

## 2021-06-10 DIAGNOSIS — Z6828 Body mass index (BMI) 28.0-28.9, adult: Secondary | ICD-10-CM | POA: Diagnosis not present

## 2021-06-10 DIAGNOSIS — M545 Low back pain, unspecified: Secondary | ICD-10-CM | POA: Diagnosis not present

## 2021-07-13 DIAGNOSIS — M545 Low back pain, unspecified: Secondary | ICD-10-CM | POA: Diagnosis not present

## 2021-07-13 DIAGNOSIS — G8929 Other chronic pain: Secondary | ICD-10-CM | POA: Diagnosis not present

## 2021-07-13 DIAGNOSIS — I10 Essential (primary) hypertension: Secondary | ICD-10-CM | POA: Diagnosis not present

## 2021-07-13 DIAGNOSIS — N200 Calculus of kidney: Secondary | ICD-10-CM | POA: Diagnosis not present

## 2021-08-12 DIAGNOSIS — F5104 Psychophysiologic insomnia: Secondary | ICD-10-CM | POA: Diagnosis not present

## 2021-08-12 DIAGNOSIS — I10 Essential (primary) hypertension: Secondary | ICD-10-CM | POA: Diagnosis not present

## 2021-08-12 DIAGNOSIS — M545 Low back pain, unspecified: Secondary | ICD-10-CM | POA: Diagnosis not present

## 2021-08-12 DIAGNOSIS — Z23 Encounter for immunization: Secondary | ICD-10-CM | POA: Diagnosis not present

## 2021-08-12 DIAGNOSIS — N200 Calculus of kidney: Secondary | ICD-10-CM | POA: Diagnosis not present

## 2021-09-08 DIAGNOSIS — J069 Acute upper respiratory infection, unspecified: Secondary | ICD-10-CM | POA: Diagnosis not present

## 2021-09-08 DIAGNOSIS — M545 Low back pain, unspecified: Secondary | ICD-10-CM | POA: Diagnosis not present

## 2021-09-08 DIAGNOSIS — I10 Essential (primary) hypertension: Secondary | ICD-10-CM | POA: Diagnosis not present

## 2021-09-08 DIAGNOSIS — G8929 Other chronic pain: Secondary | ICD-10-CM | POA: Diagnosis not present

## 2021-09-08 DIAGNOSIS — Z87891 Personal history of nicotine dependence: Secondary | ICD-10-CM | POA: Diagnosis not present

## 2021-10-07 DIAGNOSIS — M545 Low back pain, unspecified: Secondary | ICD-10-CM | POA: Diagnosis not present

## 2021-10-07 DIAGNOSIS — G8929 Other chronic pain: Secondary | ICD-10-CM | POA: Diagnosis not present

## 2021-10-07 DIAGNOSIS — I10 Essential (primary) hypertension: Secondary | ICD-10-CM | POA: Diagnosis not present

## 2021-10-07 DIAGNOSIS — N200 Calculus of kidney: Secondary | ICD-10-CM | POA: Diagnosis not present

## 2021-11-04 DIAGNOSIS — G8929 Other chronic pain: Secondary | ICD-10-CM | POA: Diagnosis not present

## 2021-11-04 DIAGNOSIS — I10 Essential (primary) hypertension: Secondary | ICD-10-CM | POA: Diagnosis not present

## 2021-11-04 DIAGNOSIS — M545 Low back pain, unspecified: Secondary | ICD-10-CM | POA: Diagnosis not present

## 2021-11-04 DIAGNOSIS — N200 Calculus of kidney: Secondary | ICD-10-CM | POA: Diagnosis not present

## 2021-11-30 DIAGNOSIS — R1012 Left upper quadrant pain: Secondary | ICD-10-CM | POA: Diagnosis not present

## 2022-01-11 DIAGNOSIS — Z1331 Encounter for screening for depression: Secondary | ICD-10-CM | POA: Diagnosis not present

## 2022-01-11 DIAGNOSIS — G8929 Other chronic pain: Secondary | ICD-10-CM | POA: Diagnosis not present

## 2022-01-11 DIAGNOSIS — I10 Essential (primary) hypertension: Secondary | ICD-10-CM | POA: Diagnosis not present

## 2022-01-11 DIAGNOSIS — R7989 Other specified abnormal findings of blood chemistry: Secondary | ICD-10-CM | POA: Diagnosis not present

## 2022-01-11 DIAGNOSIS — M545 Low back pain, unspecified: Secondary | ICD-10-CM | POA: Diagnosis not present

## 2022-01-25 DIAGNOSIS — I10 Essential (primary) hypertension: Secondary | ICD-10-CM | POA: Diagnosis not present

## 2022-01-25 DIAGNOSIS — R7989 Other specified abnormal findings of blood chemistry: Secondary | ICD-10-CM | POA: Diagnosis not present

## 2022-01-25 DIAGNOSIS — G8929 Other chronic pain: Secondary | ICD-10-CM | POA: Diagnosis not present

## 2022-01-25 DIAGNOSIS — M545 Low back pain, unspecified: Secondary | ICD-10-CM | POA: Diagnosis not present

## 2022-02-02 DIAGNOSIS — M545 Low back pain, unspecified: Secondary | ICD-10-CM | POA: Diagnosis not present

## 2022-02-02 DIAGNOSIS — I10 Essential (primary) hypertension: Secondary | ICD-10-CM | POA: Diagnosis not present

## 2022-02-02 DIAGNOSIS — G8929 Other chronic pain: Secondary | ICD-10-CM | POA: Diagnosis not present

## 2022-02-02 DIAGNOSIS — B029 Zoster without complications: Secondary | ICD-10-CM | POA: Diagnosis not present

## 2022-02-08 DIAGNOSIS — G8929 Other chronic pain: Secondary | ICD-10-CM | POA: Diagnosis not present

## 2022-02-08 DIAGNOSIS — I10 Essential (primary) hypertension: Secondary | ICD-10-CM | POA: Diagnosis not present

## 2022-02-08 DIAGNOSIS — M545 Low back pain, unspecified: Secondary | ICD-10-CM | POA: Diagnosis not present

## 2022-02-08 DIAGNOSIS — B029 Zoster without complications: Secondary | ICD-10-CM | POA: Diagnosis not present

## 2022-03-05 DIAGNOSIS — I10 Essential (primary) hypertension: Secondary | ICD-10-CM | POA: Diagnosis not present

## 2022-03-05 DIAGNOSIS — G8929 Other chronic pain: Secondary | ICD-10-CM | POA: Diagnosis not present

## 2022-03-05 DIAGNOSIS — F419 Anxiety disorder, unspecified: Secondary | ICD-10-CM | POA: Diagnosis not present

## 2022-03-05 DIAGNOSIS — M545 Low back pain, unspecified: Secondary | ICD-10-CM | POA: Diagnosis not present

## 2022-04-08 DIAGNOSIS — I10 Essential (primary) hypertension: Secondary | ICD-10-CM | POA: Diagnosis not present

## 2022-04-08 DIAGNOSIS — M545 Low back pain, unspecified: Secondary | ICD-10-CM | POA: Diagnosis not present

## 2022-04-08 DIAGNOSIS — G8929 Other chronic pain: Secondary | ICD-10-CM | POA: Diagnosis not present

## 2022-04-08 DIAGNOSIS — F419 Anxiety disorder, unspecified: Secondary | ICD-10-CM | POA: Diagnosis not present

## 2022-05-06 DIAGNOSIS — G8929 Other chronic pain: Secondary | ICD-10-CM | POA: Diagnosis not present

## 2022-05-06 DIAGNOSIS — F419 Anxiety disorder, unspecified: Secondary | ICD-10-CM | POA: Diagnosis not present

## 2022-05-06 DIAGNOSIS — M545 Low back pain, unspecified: Secondary | ICD-10-CM | POA: Diagnosis not present

## 2022-05-06 DIAGNOSIS — I10 Essential (primary) hypertension: Secondary | ICD-10-CM | POA: Diagnosis not present

## 2022-06-03 DIAGNOSIS — M545 Low back pain, unspecified: Secondary | ICD-10-CM | POA: Diagnosis not present

## 2022-06-03 DIAGNOSIS — G8929 Other chronic pain: Secondary | ICD-10-CM | POA: Diagnosis not present

## 2022-06-03 DIAGNOSIS — I10 Essential (primary) hypertension: Secondary | ICD-10-CM | POA: Diagnosis not present

## 2022-06-03 DIAGNOSIS — F419 Anxiety disorder, unspecified: Secondary | ICD-10-CM | POA: Diagnosis not present

## 2022-07-08 DIAGNOSIS — M545 Low back pain, unspecified: Secondary | ICD-10-CM | POA: Diagnosis not present

## 2022-07-08 DIAGNOSIS — I10 Essential (primary) hypertension: Secondary | ICD-10-CM | POA: Diagnosis not present

## 2022-07-08 DIAGNOSIS — G8929 Other chronic pain: Secondary | ICD-10-CM | POA: Diagnosis not present

## 2022-07-08 DIAGNOSIS — F419 Anxiety disorder, unspecified: Secondary | ICD-10-CM | POA: Diagnosis not present

## 2022-07-27 DIAGNOSIS — F419 Anxiety disorder, unspecified: Secondary | ICD-10-CM | POA: Diagnosis not present

## 2022-07-27 DIAGNOSIS — G8929 Other chronic pain: Secondary | ICD-10-CM | POA: Diagnosis not present

## 2022-07-27 DIAGNOSIS — I10 Essential (primary) hypertension: Secondary | ICD-10-CM | POA: Diagnosis not present

## 2022-07-27 DIAGNOSIS — Z0000 Encounter for general adult medical examination without abnormal findings: Secondary | ICD-10-CM | POA: Diagnosis not present

## 2022-07-27 DIAGNOSIS — M545 Low back pain, unspecified: Secondary | ICD-10-CM | POA: Diagnosis not present

## 2022-07-27 DIAGNOSIS — E291 Testicular hypofunction: Secondary | ICD-10-CM | POA: Diagnosis not present

## 2022-08-05 DIAGNOSIS — M545 Low back pain, unspecified: Secondary | ICD-10-CM | POA: Diagnosis not present

## 2022-08-05 DIAGNOSIS — G8929 Other chronic pain: Secondary | ICD-10-CM | POA: Diagnosis not present

## 2022-08-05 DIAGNOSIS — N200 Calculus of kidney: Secondary | ICD-10-CM | POA: Diagnosis not present

## 2022-08-05 DIAGNOSIS — I10 Essential (primary) hypertension: Secondary | ICD-10-CM | POA: Diagnosis not present

## 2022-09-02 DIAGNOSIS — G8929 Other chronic pain: Secondary | ICD-10-CM | POA: Diagnosis not present

## 2022-09-02 DIAGNOSIS — M545 Low back pain, unspecified: Secondary | ICD-10-CM | POA: Diagnosis not present

## 2022-09-02 DIAGNOSIS — I10 Essential (primary) hypertension: Secondary | ICD-10-CM | POA: Diagnosis not present

## 2022-09-02 DIAGNOSIS — F419 Anxiety disorder, unspecified: Secondary | ICD-10-CM | POA: Diagnosis not present

## 2022-09-02 DIAGNOSIS — Z23 Encounter for immunization: Secondary | ICD-10-CM | POA: Diagnosis not present

## 2022-09-30 DIAGNOSIS — M25512 Pain in left shoulder: Secondary | ICD-10-CM | POA: Diagnosis not present

## 2022-09-30 DIAGNOSIS — M545 Low back pain, unspecified: Secondary | ICD-10-CM | POA: Diagnosis not present

## 2022-09-30 DIAGNOSIS — G8929 Other chronic pain: Secondary | ICD-10-CM | POA: Diagnosis not present

## 2022-09-30 DIAGNOSIS — I10 Essential (primary) hypertension: Secondary | ICD-10-CM | POA: Diagnosis not present

## 2022-10-29 DIAGNOSIS — M545 Low back pain, unspecified: Secondary | ICD-10-CM | POA: Diagnosis not present

## 2022-10-29 DIAGNOSIS — I10 Essential (primary) hypertension: Secondary | ICD-10-CM | POA: Diagnosis not present

## 2022-10-29 DIAGNOSIS — F419 Anxiety disorder, unspecified: Secondary | ICD-10-CM | POA: Diagnosis not present

## 2022-10-29 DIAGNOSIS — G8929 Other chronic pain: Secondary | ICD-10-CM | POA: Diagnosis not present

## 2022-12-05 DIAGNOSIS — F419 Anxiety disorder, unspecified: Secondary | ICD-10-CM | POA: Diagnosis not present

## 2022-12-05 DIAGNOSIS — G8929 Other chronic pain: Secondary | ICD-10-CM | POA: Diagnosis not present

## 2022-12-05 DIAGNOSIS — M545 Low back pain, unspecified: Secondary | ICD-10-CM | POA: Diagnosis not present

## 2022-12-05 DIAGNOSIS — I10 Essential (primary) hypertension: Secondary | ICD-10-CM | POA: Diagnosis not present

## 2023-01-03 DIAGNOSIS — F419 Anxiety disorder, unspecified: Secondary | ICD-10-CM | POA: Diagnosis not present

## 2023-01-03 DIAGNOSIS — M545 Low back pain, unspecified: Secondary | ICD-10-CM | POA: Diagnosis not present

## 2023-01-03 DIAGNOSIS — G8929 Other chronic pain: Secondary | ICD-10-CM | POA: Diagnosis not present

## 2023-01-03 DIAGNOSIS — I10 Essential (primary) hypertension: Secondary | ICD-10-CM | POA: Diagnosis not present

## 2023-02-01 DIAGNOSIS — F419 Anxiety disorder, unspecified: Secondary | ICD-10-CM | POA: Diagnosis not present

## 2023-02-01 DIAGNOSIS — I10 Essential (primary) hypertension: Secondary | ICD-10-CM | POA: Diagnosis not present

## 2023-02-01 DIAGNOSIS — G8929 Other chronic pain: Secondary | ICD-10-CM | POA: Diagnosis not present

## 2023-02-01 DIAGNOSIS — M545 Low back pain, unspecified: Secondary | ICD-10-CM | POA: Diagnosis not present

## 2023-03-01 DIAGNOSIS — G8929 Other chronic pain: Secondary | ICD-10-CM | POA: Diagnosis not present

## 2023-03-01 DIAGNOSIS — M545 Low back pain, unspecified: Secondary | ICD-10-CM | POA: Diagnosis not present

## 2023-03-01 DIAGNOSIS — E291 Testicular hypofunction: Secondary | ICD-10-CM | POA: Diagnosis not present

## 2023-03-01 DIAGNOSIS — I10 Essential (primary) hypertension: Secondary | ICD-10-CM | POA: Diagnosis not present

## 2023-03-01 DIAGNOSIS — F419 Anxiety disorder, unspecified: Secondary | ICD-10-CM | POA: Diagnosis not present

## 2023-03-31 DIAGNOSIS — G8929 Other chronic pain: Secondary | ICD-10-CM | POA: Diagnosis not present

## 2023-03-31 DIAGNOSIS — I10 Essential (primary) hypertension: Secondary | ICD-10-CM | POA: Diagnosis not present

## 2023-03-31 DIAGNOSIS — M545 Low back pain, unspecified: Secondary | ICD-10-CM | POA: Diagnosis not present

## 2023-03-31 DIAGNOSIS — F419 Anxiety disorder, unspecified: Secondary | ICD-10-CM | POA: Diagnosis not present

## 2023-04-26 DIAGNOSIS — I10 Essential (primary) hypertension: Secondary | ICD-10-CM | POA: Diagnosis not present

## 2023-04-26 DIAGNOSIS — F419 Anxiety disorder, unspecified: Secondary | ICD-10-CM | POA: Diagnosis not present

## 2023-04-26 DIAGNOSIS — G8929 Other chronic pain: Secondary | ICD-10-CM | POA: Diagnosis not present

## 2023-04-26 DIAGNOSIS — M545 Low back pain, unspecified: Secondary | ICD-10-CM | POA: Diagnosis not present

## 2023-05-19 DIAGNOSIS — M545 Low back pain, unspecified: Secondary | ICD-10-CM | POA: Diagnosis not present

## 2023-05-19 DIAGNOSIS — G8929 Other chronic pain: Secondary | ICD-10-CM | POA: Diagnosis not present

## 2023-05-19 DIAGNOSIS — L03114 Cellulitis of left upper limb: Secondary | ICD-10-CM | POA: Diagnosis not present

## 2023-05-19 DIAGNOSIS — H1031 Unspecified acute conjunctivitis, right eye: Secondary | ICD-10-CM | POA: Diagnosis not present

## 2023-05-24 DIAGNOSIS — G8929 Other chronic pain: Secondary | ICD-10-CM | POA: Diagnosis not present

## 2023-05-24 DIAGNOSIS — M545 Low back pain, unspecified: Secondary | ICD-10-CM | POA: Diagnosis not present

## 2023-05-24 DIAGNOSIS — R42 Dizziness and giddiness: Secondary | ICD-10-CM | POA: Diagnosis not present

## 2023-05-24 DIAGNOSIS — R634 Abnormal weight loss: Secondary | ICD-10-CM | POA: Diagnosis not present

## 2023-05-25 DIAGNOSIS — R42 Dizziness and giddiness: Secondary | ICD-10-CM | POA: Diagnosis not present

## 2023-05-25 DIAGNOSIS — R634 Abnormal weight loss: Secondary | ICD-10-CM | POA: Diagnosis not present

## 2023-05-25 DIAGNOSIS — D72829 Elevated white blood cell count, unspecified: Secondary | ICD-10-CM | POA: Diagnosis not present

## 2023-05-25 DIAGNOSIS — N179 Acute kidney failure, unspecified: Secondary | ICD-10-CM | POA: Diagnosis not present

## 2023-05-26 DIAGNOSIS — N179 Acute kidney failure, unspecified: Secondary | ICD-10-CM | POA: Diagnosis not present

## 2023-05-26 DIAGNOSIS — N261 Atrophy of kidney (terminal): Secondary | ICD-10-CM | POA: Diagnosis not present

## 2023-05-27 DIAGNOSIS — K4090 Unilateral inguinal hernia, without obstruction or gangrene, not specified as recurrent: Secondary | ICD-10-CM | POA: Diagnosis not present

## 2023-05-27 DIAGNOSIS — N179 Acute kidney failure, unspecified: Secondary | ICD-10-CM | POA: Diagnosis not present

## 2023-05-27 DIAGNOSIS — K429 Umbilical hernia without obstruction or gangrene: Secondary | ICD-10-CM | POA: Diagnosis not present

## 2023-05-27 DIAGNOSIS — K5730 Diverticulosis of large intestine without perforation or abscess without bleeding: Secondary | ICD-10-CM | POA: Diagnosis not present

## 2023-05-27 DIAGNOSIS — N200 Calculus of kidney: Secondary | ICD-10-CM | POA: Diagnosis not present

## 2023-05-27 DIAGNOSIS — R109 Unspecified abdominal pain: Secondary | ICD-10-CM | POA: Diagnosis not present

## 2023-05-30 DIAGNOSIS — R0600 Dyspnea, unspecified: Secondary | ICD-10-CM | POA: Diagnosis not present

## 2023-06-07 DIAGNOSIS — D72829 Elevated white blood cell count, unspecified: Secondary | ICD-10-CM | POA: Diagnosis not present

## 2023-06-07 DIAGNOSIS — I10 Essential (primary) hypertension: Secondary | ICD-10-CM | POA: Diagnosis not present

## 2023-06-07 DIAGNOSIS — N179 Acute kidney failure, unspecified: Secondary | ICD-10-CM | POA: Diagnosis not present

## 2023-06-07 DIAGNOSIS — M545 Low back pain, unspecified: Secondary | ICD-10-CM | POA: Diagnosis not present

## 2023-06-07 DIAGNOSIS — R42 Dizziness and giddiness: Secondary | ICD-10-CM | POA: Diagnosis not present

## 2023-06-21 DIAGNOSIS — D72829 Elevated white blood cell count, unspecified: Secondary | ICD-10-CM | POA: Diagnosis not present

## 2023-06-21 DIAGNOSIS — I10 Essential (primary) hypertension: Secondary | ICD-10-CM | POA: Diagnosis not present

## 2023-06-21 DIAGNOSIS — R42 Dizziness and giddiness: Secondary | ICD-10-CM | POA: Diagnosis not present

## 2023-06-21 DIAGNOSIS — N179 Acute kidney failure, unspecified: Secondary | ICD-10-CM | POA: Diagnosis not present

## 2023-07-20 DIAGNOSIS — Z23 Encounter for immunization: Secondary | ICD-10-CM | POA: Diagnosis not present

## 2023-07-20 DIAGNOSIS — I10 Essential (primary) hypertension: Secondary | ICD-10-CM | POA: Diagnosis not present

## 2023-07-20 DIAGNOSIS — R42 Dizziness and giddiness: Secondary | ICD-10-CM | POA: Diagnosis not present

## 2023-07-20 DIAGNOSIS — D72829 Elevated white blood cell count, unspecified: Secondary | ICD-10-CM | POA: Diagnosis not present

## 2023-07-20 DIAGNOSIS — M545 Low back pain, unspecified: Secondary | ICD-10-CM | POA: Diagnosis not present

## 2023-08-17 DIAGNOSIS — R42 Dizziness and giddiness: Secondary | ICD-10-CM | POA: Diagnosis not present

## 2023-08-17 DIAGNOSIS — M545 Low back pain, unspecified: Secondary | ICD-10-CM | POA: Diagnosis not present

## 2023-08-17 DIAGNOSIS — M25552 Pain in left hip: Secondary | ICD-10-CM | POA: Diagnosis not present

## 2023-08-17 DIAGNOSIS — I10 Essential (primary) hypertension: Secondary | ICD-10-CM | POA: Diagnosis not present

## 2023-08-17 DIAGNOSIS — G8929 Other chronic pain: Secondary | ICD-10-CM | POA: Diagnosis not present

## 2023-09-14 DIAGNOSIS — G8929 Other chronic pain: Secondary | ICD-10-CM | POA: Diagnosis not present

## 2023-09-14 DIAGNOSIS — R42 Dizziness and giddiness: Secondary | ICD-10-CM | POA: Diagnosis not present

## 2023-09-14 DIAGNOSIS — I10 Essential (primary) hypertension: Secondary | ICD-10-CM | POA: Diagnosis not present

## 2023-09-14 DIAGNOSIS — M545 Low back pain, unspecified: Secondary | ICD-10-CM | POA: Diagnosis not present

## 2023-10-11 DIAGNOSIS — R42 Dizziness and giddiness: Secondary | ICD-10-CM | POA: Diagnosis not present

## 2023-10-11 DIAGNOSIS — I10 Essential (primary) hypertension: Secondary | ICD-10-CM | POA: Diagnosis not present

## 2023-10-11 DIAGNOSIS — M25552 Pain in left hip: Secondary | ICD-10-CM | POA: Diagnosis not present

## 2023-10-11 DIAGNOSIS — G8929 Other chronic pain: Secondary | ICD-10-CM | POA: Diagnosis not present

## 2023-10-11 DIAGNOSIS — M545 Low back pain, unspecified: Secondary | ICD-10-CM | POA: Diagnosis not present

## 2024-05-10 ENCOUNTER — Emergency Department (HOSPITAL_COMMUNITY)
Admission: EM | Admit: 2024-05-10 | Discharge: 2024-05-11 | Disposition: A | Discharge status: Home / Self Care | Attending: Emergency Medicine | Admitting: Emergency Medicine

## 2024-05-10 ENCOUNTER — Emergency Department (HOSPITAL_COMMUNITY)

## 2024-05-10 ENCOUNTER — Other Ambulatory Visit: Payer: Self-pay

## 2024-05-10 ENCOUNTER — Encounter (HOSPITAL_COMMUNITY): Payer: Self-pay

## 2024-05-10 DIAGNOSIS — E872 Acidosis, unspecified: Secondary | ICD-10-CM | POA: Insufficient documentation

## 2024-05-10 DIAGNOSIS — S01311A Laceration without foreign body of right ear, initial encounter: Secondary | ICD-10-CM | POA: Insufficient documentation

## 2024-05-10 DIAGNOSIS — Y907 Blood alcohol level of 200-239 mg/100 ml: Secondary | ICD-10-CM | POA: Diagnosis not present

## 2024-05-10 DIAGNOSIS — S3991XA Unspecified injury of abdomen, initial encounter: Secondary | ICD-10-CM | POA: Diagnosis not present

## 2024-05-10 DIAGNOSIS — R079 Chest pain, unspecified: Secondary | ICD-10-CM | POA: Insufficient documentation

## 2024-05-10 DIAGNOSIS — S0101XA Laceration without foreign body of scalp, initial encounter: Secondary | ICD-10-CM | POA: Diagnosis not present

## 2024-05-10 DIAGNOSIS — W108XXA Fall (on) (from) other stairs and steps, initial encounter: Secondary | ICD-10-CM | POA: Diagnosis not present

## 2024-05-10 DIAGNOSIS — D72829 Elevated white blood cell count, unspecified: Secondary | ICD-10-CM | POA: Diagnosis not present

## 2024-05-10 DIAGNOSIS — F10920 Alcohol use, unspecified with intoxication, uncomplicated: Secondary | ICD-10-CM | POA: Insufficient documentation

## 2024-05-10 DIAGNOSIS — S0990XA Unspecified injury of head, initial encounter: Secondary | ICD-10-CM | POA: Diagnosis present

## 2024-05-10 LAB — CBC
HCT: 40.9 % (ref 39.0–52.0)
Hemoglobin: 14.1 g/dL (ref 13.0–17.0)
MCH: 30.5 pg (ref 26.0–34.0)
MCHC: 34.5 g/dL (ref 30.0–36.0)
MCV: 88.5 fL (ref 80.0–100.0)
Platelets: 305 K/uL (ref 150–400)
RBC: 4.62 MIL/uL (ref 4.22–5.81)
RDW: 12.9 % (ref 11.5–15.5)
WBC: 16.1 K/uL — ABNORMAL HIGH (ref 4.0–10.5)
nRBC: 0 % (ref 0.0–0.2)

## 2024-05-10 LAB — I-STAT CHEM 8, ED
BUN: 17 mg/dL (ref 6–20)
Calcium, Ion: 1.05 mmol/L — ABNORMAL LOW (ref 1.15–1.40)
Chloride: 104 mmol/L (ref 98–111)
Creatinine, Ser: 2 mg/dL — ABNORMAL HIGH (ref 0.61–1.24)
Glucose, Bld: 114 mg/dL — ABNORMAL HIGH (ref 70–99)
HCT: 41 % (ref 39.0–52.0)
Hemoglobin: 13.9 g/dL (ref 13.0–17.0)
Potassium: 4.5 mmol/L (ref 3.5–5.1)
Sodium: 138 mmol/L (ref 135–145)
TCO2: 19 mmol/L — ABNORMAL LOW (ref 22–32)

## 2024-05-10 LAB — COMPREHENSIVE METABOLIC PANEL WITH GFR
ALT: 24 U/L (ref 0–44)
AST: 26 U/L (ref 15–41)
Albumin: 3.8 g/dL (ref 3.5–5.0)
Alkaline Phosphatase: 62 U/L (ref 38–126)
Anion gap: 13 (ref 5–15)
BUN: 15 mg/dL (ref 6–20)
CO2: 19 mmol/L — ABNORMAL LOW (ref 22–32)
Calcium: 8.3 mg/dL — ABNORMAL LOW (ref 8.9–10.3)
Chloride: 105 mmol/L (ref 98–111)
Creatinine, Ser: 1.71 mg/dL — ABNORMAL HIGH (ref 0.61–1.24)
GFR, Estimated: 48 mL/min — ABNORMAL LOW (ref >60)
Glucose, Bld: 113 mg/dL — ABNORMAL HIGH (ref 70–99)
Potassium: 4.6 mmol/L (ref 3.5–5.1)
Sodium: 137 mmol/L (ref 135–145)
Total Bilirubin: 0.7 mg/dL (ref 0.0–1.2)
Total Protein: 6.6 g/dL (ref 6.5–8.1)

## 2024-05-10 LAB — PROTIME-INR
INR: 1 (ref 0.8–1.2)
Prothrombin Time: 14.1 s (ref 11.4–15.2)

## 2024-05-10 LAB — I-STAT CG4 LACTIC ACID, ED: Lactic Acid, Venous: 2.9 mmol/L (ref 0.5–1.9)

## 2024-05-10 LAB — SAMPLE TO BLOOD BANK

## 2024-05-10 LAB — ETHANOL: Alcohol, Ethyl (B): 223 mg/dL — ABNORMAL HIGH (ref <15)

## 2024-05-10 LAB — TROPONIN I (HIGH SENSITIVITY): Troponin I (High Sensitivity): 3 ng/L (ref <18)

## 2024-05-10 MED ORDER — FENTANYL CITRATE PF 50 MCG/ML IJ SOSY
50.0000 ug | PREFILLED_SYRINGE | Freq: Once | INTRAMUSCULAR | Status: AC
  Administered 2024-05-10: 50 ug via INTRAVENOUS
  Filled 2024-05-10: qty 1

## 2024-05-10 MED ORDER — LIDOCAINE HCL (PF) 1 % IJ SOLN
10.0000 mL | Freq: Once | INTRAMUSCULAR | Status: AC
  Administered 2024-05-10: 10 mL via INTRADERMAL
  Filled 2024-05-10: qty 10

## 2024-05-10 MED ORDER — HYDROCODONE-ACETAMINOPHEN 5-325 MG PO TABS: 1.0000 | ORAL_TABLET | Freq: Four times a day (QID) | ORAL | 0 refills | Status: AC | PRN

## 2024-05-10 MED ORDER — LACTATED RINGERS IV BOLUS
1000.0000 mL | Freq: Once | INTRAVENOUS | Status: AC
  Administered 2024-05-10: 1000 mL via INTRAVENOUS

## 2024-05-10 MED ORDER — FENTANYL CITRATE PF 50 MCG/ML IJ SOSY: 50.0000 ug | PREFILLED_SYRINGE | Freq: Once | INTRAMUSCULAR | Status: AC

## 2024-05-10 MED ORDER — METHOCARBAMOL 500 MG PO TABS: 500.0000 mg | ORAL_TABLET | Freq: Three times a day (TID) | ORAL | 0 refills | Status: AC | PRN

## 2024-05-10 MED ORDER — METHOCARBAMOL 500 MG PO TABS
500.0000 mg | ORAL_TABLET | Freq: Once | ORAL | Status: AC
  Administered 2024-05-10: 500 mg via ORAL
  Filled 2024-05-10: qty 1

## 2024-05-10 MED ORDER — IOHEXOL 350 MG/ML SOLN
75.0000 mL | Freq: Once | INTRAVENOUS | Status: AC | PRN
  Administered 2024-05-10: 75 mL via INTRAVENOUS

## 2024-05-10 MED ORDER — FENTANYL CITRATE PF 50 MCG/ML IJ SOSY
PREFILLED_SYRINGE | INTRAMUSCULAR | Status: AC
Start: 2024-05-10 — End: 2024-05-10
  Administered 2024-05-10: 50 ug via INTRAVENOUS
  Filled 2024-05-10: qty 1

## 2024-05-10 MED ORDER — IOHEXOL 350 MG/ML SOLN: 75.0000 mL | Freq: Once | INTRAVENOUS | Status: DC | PRN

## 2024-05-10 NOTE — ED Notes (Signed)
 Delay in lab results, main lab could not access pt's account. KM
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
## 2024-05-10 NOTE — ED Notes (Signed)
 Pt not in room...KM

## 2024-05-10 NOTE — Discharge Instructions (Signed)
 You were seen today for traumatic injuries after a fall down multiple stairs. While you were here we monitored your vitals, performed a physical exam, and checked labs/imaging. These were all reassuring and there is no indication for any further testing or intervention in the emergency department at this time.   Things to do:  - Follow up with your primary care provider within the next 1-2 weeks, or sooner as needed for persistent/worsening symptoms - Take robaxin 500mg  three times daily as needed for muscular pain. For moderate pain, also take 1 norco pill, or 2 norco pills for severe pain, every 6 hours as needed. Do not take tylenol  in addition to norco. Do not drink alcohol while taking these medications  Return to the emergency department if you have any new or worsening symptoms, or if you have any other serious medical concerns.

## 2024-05-11 NOTE — ED Provider Notes (Incomplete)
 Patient's troponin is 3  Considered for mission or further workup however patient's labs and imaging are reassuring.  Patient given outpatient course of Robaxin and Norco.  Patient given return precautions.  Patient to follow-up with his primary care in the upcoming weeks.  I feel patient safe for discharge at this time.

## 2024-05-11 NOTE — ED Provider Notes (Signed)
 Patient's troponin is 3  Considered for mission or further workup however patient's labs and imaging are reassuring.  Patient given outpatient course of Robaxin  and Norco.  Patient given return precautions.  Patient to follow-up with his primary care in the upcoming weeks.  I feel patient safe for discharge at this time.   Francis Ileana SAILOR, PA-C 05/11/24 0017    Jerral Meth, MD 05/11/24 [phone]

## 2024-05-15 ENCOUNTER — Other Ambulatory Visit: Payer: Self-pay

## 2024-05-15 ENCOUNTER — Emergency Department (HOSPITAL_COMMUNITY)
Admission: EM | Admit: 2024-05-15 | Discharge: 2024-05-15 | Disposition: A | Discharge status: Home / Self Care | Attending: Emergency Medicine | Admitting: Emergency Medicine

## 2024-05-15 ENCOUNTER — Emergency Department (HOSPITAL_COMMUNITY)

## 2024-05-15 DIAGNOSIS — M25511 Pain in right shoulder: Secondary | ICD-10-CM | POA: Diagnosis present

## 2024-05-15 DIAGNOSIS — Z79899 Other long term (current) drug therapy: Secondary | ICD-10-CM | POA: Diagnosis not present

## 2024-05-15 DIAGNOSIS — I10 Essential (primary) hypertension: Secondary | ICD-10-CM | POA: Diagnosis not present

## 2024-05-15 MED ORDER — KETOROLAC TROMETHAMINE 15 MG/ML IJ SOLN
30.0000 mg | Freq: Once | INTRAMUSCULAR | Status: AC
  Administered 2024-05-15: 30 mg via INTRAMUSCULAR
  Filled 2024-05-15: qty 2

## 2024-05-15 MED ORDER — HYDROCODONE-ACETAMINOPHEN 5-325 MG PO TABS
1.0000 | ORAL_TABLET | Freq: Once | ORAL | Status: AC
  Administered 2024-05-15: 1 via ORAL
  Filled 2024-05-15: qty 1

## 2024-05-15 NOTE — ED Notes (Signed)
 Patient Alert and oriented to baseline. Stable and ambulatory to baseline. Patient verbalized understanding of the discharge instructions.  Patient belongings were taken by the patient.

## 2024-05-15 NOTE — ED Notes (Signed)
Ice pack applied to R shoulder

## 2024-05-15 NOTE — ED Triage Notes (Signed)
 Pt. Stated, I fell off steps last Friday and came here and had xrays but I can't hardly move my right arm its so painful.

## 2024-05-15 NOTE — ED Provider Notes (Signed)
 [facility] EMERGENCY DEPARTMENT AT [facility] Provider Note   CSN: [id number] Arrival date & time: 05/15/24  9248     Patient presents with: Arm Pain  HPI Troy Black. is a 53 y.o. male with hypertension and history of kidney stone presenting for right shoulder pain.  Was here last Friday after a fall on his right side.  Workup overall was reassuring at that time.  He denies any new trauma to his shoulder.  The pain is primarily localized to the posterior aspect of the joint and has been present since his fall.  Denies radiation of that pain into the chest or midline of the back.  Denies shortness of breath.  He has been taking those pills they gave me for pain which has helped somewhat.  The pain is worse with movement.  Denies numbness or weakness.    Arm Pain       Prior to Admission medications   Medication Sig Start Date End Date Taking? Authorizing Provider  ALPRAZolam (XANAX) 0.25 MG tablet Take 0.25 mg by mouth at bedtime.    [provider]  amLODipine (NORVASC) 5 MG tablet Take 5 mg by mouth daily.    [provider]  buPROPion (WELLBUTRIN XL) 300 MG 24 hr tablet Take 300 mg by mouth daily. 04/15/24   [provider]  citalopram (CELEXA) 40 MG tablet Take 40 mg by mouth at bedtime.    [provider]  cyanocobalamin  (VITAMIN B12) 1000 MCG tablet Take 1,000 mcg by mouth daily. 05/18/18   [provider]  ferrous sulfate 325 (65 FE) MG EC tablet Take 325 mg by mouth every other day. 05/18/18   [provider]  gabapentin (NEURONTIN) 600 MG tablet Take 600 mg by mouth 3 (three) times daily. 04/15/24   [provider]  lisinopril (PRINIVIL,ZESTRIL) 20 MG tablet Take 20 mg by mouth daily.    [provider]  meloxicam (MOBIC) 15 MG tablet Take 15 mg by mouth daily. 04/15/24   [provider]  methocarbamol  (ROBAXIN ) 500 MG tablet Take 1 tablet (500 mg total) by mouth every 8 (eight)  hours as needed for up to 7 days for muscle spasms. 05/10/24 05/17/24  Dombroski, Allison, MD  traMADol (ULTRAM) 50 MG tablet Take by mouth every 6 (six) hours as needed.    [provider]  zolpidem (AMBIEN) 10 MG tablet Take 10 mg by mouth at bedtime as needed. 04/21/24   [provider]    Allergies: Duloxetine    Review of Systems See HPI  Updated Vital Signs BP (!) 153/116   Pulse 71   Temp (!) 97.3 F (36.3 C)   Resp 20   SpO2 98%   Physical Exam Constitutional:      Appearance: Normal appearance.  HENT:     Head: Normocephalic.     Nose: Nose normal.  Eyes:     Conjunctiva/sclera: Conjunctivae normal.  Pulmonary:     Effort: Pulmonary effort is normal.  Musculoskeletal:     Right shoulder: Tenderness present. No swelling, deformity, effusion or crepitus. Decreased range of motion. Normal strength. Normal pulse.     Left shoulder: Normal.  Neurological:     Mental Status: He is alert.  Psychiatric:        Mood and Affect: Mood normal.     (all labs ordered are listed, but only abnormal results are displayed) Labs Reviewed - No data to display  EKG: None  Radiology: DG  Shoulder Right Result Date: 05/15/2024 CLINICAL DATA:  Chest pain EXAM: RIGHT SHOULDER - 2+ VIEW COMPARISON:  None Available. FINDINGS: Glenohumeral joint is intact. No evidence of scapular fracture or humeral fracture. The acromioclavicular joint is intact. IMPRESSION: No fracture or dislocation. Electronically Signed   By: Jackquline Boxer M.D.   On: 05/15/2024 10:01     Procedures   Medications Ordered in the ED  HYDROcodone -acetaminophen  (NORCO/VICODIN) 5-325 MG per tablet 1 tablet (1 tablet Oral Given 05/15/24 0909)                                    Medical Decision Making Amount and/or Complexity of Data Reviewed Radiology: ordered.  Risk Prescription drug management.   53 year old well-appearing male presenting for right shoulder pain after a fall a week ago.  Exam notable for decreased range of motion and generalized tenderness but otherwise reassuring and neurovascularly intact.  X-ray did not reveal any acute findings. The joint does not appear infected. Also doubt vascular lesion given reassuring 2+ radial pulse bilaterally.  Likely soft tissue injury sustained during his fall a week ago.  Advised RICE and NSAIDs and to follow-up with his PCP.  Applied a shoulder sling for comfort but also advised him to continue gently stretching and mobilizing his shoulder to prevent frozen shoulder.  Discussed return precautions.  Discharged good condition.     Final diagnoses:  Right shoulder pain, unspecified chronicity    ED Discharge Orders     None          Lang Norleen POUR, PA-C 05/15/24 1022    Ruthe Cornet, DO 05/15/24 1134

## 2024-05-15 NOTE — Discharge Instructions (Addendum)
 Evaluation today was overall reassuring.  X-ray was negative for fracture or dislocation.  Please continue conservative treatment at home.  You can use the sling for comfort but I do recommend that you gently stretch and mobilize your shoulder to prevent frozen shoulder.  Please follow-up with PCP.  In the meantime recommend Tylenol  and ibuprofen  for pain.
